# Patient Record
Sex: Male | Born: 1955 | Marital: Single | Smoking: Current every day smoker
Health system: Southern US, Community
[De-identification: ages and names within clinical notes are randomized; demographics above are authoritative.]

## PROBLEM LIST (undated history)

## (undated) DIAGNOSIS — F99 Mental disorder, not otherwise specified: Secondary | ICD-10-CM

## (undated) DIAGNOSIS — I2510 Atherosclerotic heart disease of native coronary artery without angina pectoris: Secondary | ICD-10-CM

## (undated) DIAGNOSIS — I10 Essential (primary) hypertension: Secondary | ICD-10-CM

## (undated) DIAGNOSIS — I639 Cerebral infarction, unspecified: Secondary | ICD-10-CM

## (undated) DIAGNOSIS — J45909 Unspecified asthma, uncomplicated: Secondary | ICD-10-CM

---

## 2006-01-04 ENCOUNTER — Emergency Department: Payer: Self-pay | Admitting: Emergency Medicine

## 2007-02-04 ENCOUNTER — Inpatient Hospital Stay: Payer: Self-pay | Admitting: Unknown Physician Specialty

## 2008-04-28 ENCOUNTER — Emergency Department: Payer: Self-pay | Admitting: Emergency Medicine

## 2009-04-29 ENCOUNTER — Emergency Department: Payer: Self-pay | Admitting: Emergency Medicine

## 2010-02-28 ENCOUNTER — Inpatient Hospital Stay: Payer: Self-pay | Admitting: Internal Medicine

## 2011-02-04 ENCOUNTER — Inpatient Hospital Stay: Payer: Self-pay | Admitting: Internal Medicine

## 2011-05-11 ENCOUNTER — Inpatient Hospital Stay: Payer: Self-pay | Admitting: Internal Medicine

## 2012-04-02 LAB — COMPREHENSIVE METABOLIC PANEL
Albumin: 3.8 g/dL (ref 3.4–5.0)
Alkaline Phosphatase: 105 U/L (ref 50–136)
Anion Gap: 10 (ref 7–16)
BUN: 13 mg/dL (ref 7–18)
Bilirubin,Total: 0.5 mg/dL (ref 0.2–1.0)
Calcium, Total: 9.1 mg/dL (ref 8.5–10.1)
Chloride: 106 mmol/L (ref 98–107)
Co2: 25 mmol/L (ref 21–32)
EGFR (African American): >60
EGFR (Non-African Amer.): >60
Glucose: 83 mg/dL (ref 65–99)
Potassium: 3.3 mmol/L — ABNORMAL LOW (ref 3.5–5.1)
SGOT(AST): 39 U/L — ABNORMAL HIGH (ref 15–37)
Total Protein: 8.1 g/dL (ref 6.4–8.2)

## 2012-04-02 LAB — URINALYSIS, COMPLETE
Bilirubin,UR: NEGATIVE
Blood: NEGATIVE
Glucose,UR: NEGATIVE mg/dL (ref 0–75)
Leukocyte Esterase: NEGATIVE
Ph: 5 (ref 4.5–8.0)
Protein: 30
Specific Gravity: 1.03 (ref 1.003–1.030)
Squamous Epithelial: <1

## 2012-04-02 LAB — TROPONIN I: Troponin-I: <0.02 ng/mL

## 2012-04-02 LAB — CK TOTAL AND CKMB (NOT AT ARMC)
CK, Total: 103 U/L (ref 35–232)
CK-MB: 0.8 ng/mL (ref 0.5–3.6)

## 2012-04-02 LAB — CBC
HCT: 52 % (ref 40.0–52.0)
MCH: 33.3 pg (ref 26.0–34.0)
MCV: 97 fL (ref 80–100)
Platelet: 348 10*3/uL (ref 150–440)
RBC: 5.37 10*6/uL (ref 4.40–5.90)
RDW: 13.7 % (ref 11.5–14.5)
WBC: 6.8 10*3/uL (ref 3.8–10.6)

## 2012-04-03 ENCOUNTER — Inpatient Hospital Stay: Payer: Self-pay | Admitting: Internal Medicine

## 2012-04-03 DIAGNOSIS — I359 Nonrheumatic aortic valve disorder, unspecified: Secondary | ICD-10-CM

## 2012-04-03 LAB — CBC WITH DIFFERENTIAL/PLATELET
Basophil %: 0.6 %
HCT: 47.8 % (ref 40.0–52.0)
HGB: 16.4 g/dL (ref 13.0–18.0)
Lymphocyte %: 56.7 %
MCHC: 34.3 g/dL (ref 32.0–36.0)
MCV: 97 fL (ref 80–100)
Monocyte %: 11.9 %
Neutrophil #: 1.6 10*3/uL (ref 1.4–6.5)
Platelet: 319 10*3/uL (ref 150–440)
WBC: 5.4 10*3/uL (ref 3.8–10.6)

## 2012-04-03 LAB — LIPID PANEL
Cholesterol: 153 mg/dL (ref 0–200)
HDL Cholesterol: 27 mg/dL — ABNORMAL LOW (ref 40–60)
Triglycerides: 115 mg/dL (ref 0–200)
VLDL Cholesterol, Calc: 23 mg/dL (ref 5–40)

## 2012-04-03 LAB — HEMOGLOBIN A1C: Hemoglobin A1C: 5.5 % (ref 4.2–6.3)

## 2012-04-04 LAB — MAGNESIUM: Magnesium: 1.9 mg/dL

## 2012-04-04 LAB — POTASSIUM: Potassium: 3.6 mmol/L (ref 3.5–5.1)

## 2012-04-23 ENCOUNTER — Other Ambulatory Visit: Payer: Self-pay | Admitting: Neurology

## 2012-04-23 LAB — URINALYSIS, COMPLETE
Bacteria: NONE SEEN
Bilirubin,UR: NEGATIVE
Blood: NEGATIVE
Glucose,UR: NEGATIVE mg/dL (ref 0–75)
Leukocyte Esterase: NEGATIVE
Nitrite: NEGATIVE
Protein: NEGATIVE
RBC,UR: <1 /HPF (ref 0–5)
WBC UR: NONE SEEN /HPF (ref 0–5)

## 2012-04-24 LAB — URINE CULTURE

## 2012-09-28 ENCOUNTER — Encounter (HOSPITAL_COMMUNITY): Payer: Self-pay | Admitting: *Deleted

## 2012-09-28 ENCOUNTER — Emergency Department: Payer: Self-pay | Admitting: Emergency Medicine

## 2012-09-28 ENCOUNTER — Inpatient Hospital Stay (HOSPITAL_COMMUNITY)
Admission: EM | Admit: 2012-09-28 | Discharge: 2012-10-05 | DRG: 897 | Disposition: A | Discharge status: Home / Self Care | Payer: Medicare Other | Source: Intra-hospital | Attending: Psychiatry | Admitting: Psychiatry

## 2012-09-28 DIAGNOSIS — I2510 Atherosclerotic heart disease of native coronary artery without angina pectoris: Secondary | ICD-10-CM | POA: Diagnosis present

## 2012-09-28 DIAGNOSIS — Z79899 Other long term (current) drug therapy: Secondary | ICD-10-CM

## 2012-09-28 DIAGNOSIS — J45909 Unspecified asthma, uncomplicated: Secondary | ICD-10-CM | POA: Diagnosis present

## 2012-09-28 DIAGNOSIS — F10129 Alcohol abuse with intoxication, unspecified: Secondary | ICD-10-CM | POA: Diagnosis present

## 2012-09-28 DIAGNOSIS — F1020 Alcohol dependence, uncomplicated: Principal | ICD-10-CM | POA: Diagnosis present

## 2012-09-28 DIAGNOSIS — F1410 Cocaine abuse, uncomplicated: Secondary | ICD-10-CM | POA: Diagnosis present

## 2012-09-28 DIAGNOSIS — I10 Essential (primary) hypertension: Secondary | ICD-10-CM | POA: Diagnosis present

## 2012-09-28 HISTORY — DX: Cerebral infarction, unspecified: I63.9

## 2012-09-28 HISTORY — DX: Mental disorder, not otherwise specified: F99

## 2012-09-28 HISTORY — DX: Atherosclerotic heart disease of native coronary artery without angina pectoris: I25.10

## 2012-09-28 HISTORY — DX: Essential (primary) hypertension: I10

## 2012-09-28 HISTORY — DX: Unspecified asthma, uncomplicated: J45.909

## 2012-09-28 LAB — URINALYSIS, COMPLETE
Bacteria: NONE SEEN
Blood: NEGATIVE
Glucose,UR: NEGATIVE mg/dL (ref 0–75)
Ketone: NEGATIVE
Leukocyte Esterase: NEGATIVE
Ph: 5 (ref 4.5–8.0)
Protein: NEGATIVE
RBC,UR: NONE SEEN /HPF (ref 0–5)
Squamous Epithelial: NONE SEEN
WBC UR: 1 /HPF (ref 0–5)

## 2012-09-28 LAB — COMPREHENSIVE METABOLIC PANEL
Albumin: 4.1 g/dL (ref 3.4–5.0)
Alkaline Phosphatase: 106 U/L (ref 50–136)
BUN: 13 mg/dL (ref 7–18)
Bilirubin,Total: 0.6 mg/dL (ref 0.2–1.0)
Calcium, Total: 9.6 mg/dL (ref 8.5–10.1)
Chloride: 102 mmol/L (ref 98–107)
Co2: 30 mmol/L (ref 21–32)
Creatinine: 0.89 mg/dL (ref 0.60–1.30)
Glucose: 92 mg/dL (ref 65–99)
Sodium: 136 mmol/L (ref 136–145)

## 2012-09-28 LAB — DRUG SCREEN, URINE
Amphetamines, Ur Screen: NEGATIVE (ref ?–1000)
Benzodiazepine, Ur Scrn: NEGATIVE (ref ?–200)
MDMA (Ecstasy)Ur Screen: NEGATIVE (ref ?–500)
Methadone, Ur Screen: NEGATIVE (ref ?–300)
Phencyclidine (PCP) Ur S: NEGATIVE (ref ?–25)
Tricyclic, Ur Screen: NEGATIVE (ref ?–1000)

## 2012-09-28 LAB — TSH: Thyroid Stimulating Horm: 1.16 u[IU]/mL

## 2012-09-28 LAB — CBC
MCH: 29.5 pg (ref 26.0–34.0)
RDW: 13.3 % (ref 11.5–14.5)

## 2012-09-28 LAB — ETHANOL: Ethanol %: <0.003 % (ref 0.000–0.080)

## 2012-09-28 MED ORDER — ADULT MULTIVITAMIN W/MINERALS CH
1.0000 | ORAL_TABLET | Freq: Every day | ORAL | Status: DC
  Administered 2012-09-28 – 2012-10-05 (×8): 1 via ORAL
  Filled 2012-09-28 (×11): qty 1

## 2012-09-28 MED ORDER — CHLORDIAZEPOXIDE HCL 25 MG PO CAPS
25.0000 mg | ORAL_CAPSULE | Freq: Three times a day (TID) | ORAL | Status: AC
  Administered 2012-09-30 – 2012-10-01 (×3): 25 mg via ORAL
  Filled 2012-09-28 (×3): qty 1

## 2012-09-28 MED ORDER — LOPERAMIDE HCL 2 MG PO CAPS: 2.0000 mg | ORAL_CAPSULE | ORAL | Status: AC | PRN

## 2012-09-28 MED ORDER — CHLORDIAZEPOXIDE HCL 25 MG PO CAPS
25.0000 mg | ORAL_CAPSULE | Freq: Once | ORAL | Status: AC
  Administered 2012-09-28: 25 mg via ORAL
  Filled 2012-09-28: qty 1

## 2012-09-28 MED ORDER — CHLORDIAZEPOXIDE HCL 25 MG PO CAPS
25.0000 mg | ORAL_CAPSULE | Freq: Four times a day (QID) | ORAL | Status: AC | PRN
  Administered 2012-09-30: 25 mg via ORAL
  Filled 2012-09-28: qty 1

## 2012-09-28 MED ORDER — VITAMIN B-1 100 MG PO TABS
100.0000 mg | ORAL_TABLET | Freq: Every day | ORAL | Status: DC
  Administered 2012-09-29 – 2012-10-05 (×7): 100 mg via ORAL
  Filled 2012-09-28 (×9): qty 1

## 2012-09-28 MED ORDER — CHLORDIAZEPOXIDE HCL 25 MG PO CAPS
25.0000 mg | ORAL_CAPSULE | Freq: Four times a day (QID) | ORAL | Status: AC
  Administered 2012-09-28 – 2012-09-30 (×6): 25 mg via ORAL
  Filled 2012-09-28 (×6): qty 1

## 2012-09-28 MED ORDER — MAGNESIUM HYDROXIDE 400 MG/5ML PO SUSP
30.0000 mL | Freq: Every day | ORAL | Status: DC | PRN
  Administered 2012-09-29: 30 mL via ORAL

## 2012-09-28 MED ORDER — NICOTINE 21 MG/24HR TD PT24
21.0000 mg | MEDICATED_PATCH | Freq: Every day | TRANSDERMAL | Status: DC
  Administered 2012-09-29 – 2012-10-05 (×7): 21 mg via TRANSDERMAL
  Filled 2012-09-28 (×9): qty 1

## 2012-09-28 MED ORDER — CHLORDIAZEPOXIDE HCL 25 MG PO CAPS
25.0000 mg | ORAL_CAPSULE | ORAL | Status: AC
  Administered 2012-10-01 – 2012-10-02 (×2): 25 mg via ORAL
  Filled 2012-09-28 (×2): qty 1

## 2012-09-28 MED ORDER — CHLORDIAZEPOXIDE HCL 25 MG PO CAPS
25.0000 mg | ORAL_CAPSULE | Freq: Every day | ORAL | Status: AC
  Administered 2012-10-03: 25 mg via ORAL
  Filled 2012-09-28: qty 1

## 2012-09-28 MED ORDER — ONDANSETRON 4 MG PO TBDP: 4.0000 mg | ORAL_TABLET | Freq: Four times a day (QID) | ORAL | Status: AC | PRN

## 2012-09-28 MED ORDER — ALUM & MAG HYDROXIDE-SIMETH 200-200-20 MG/5ML PO SUSP: 30.0000 mL | ORAL | Status: DC | PRN

## 2012-09-28 MED ORDER — THIAMINE HCL 100 MG/ML IJ SOLN: 100.0000 mg | Freq: Once | INTRAMUSCULAR | Status: DC

## 2012-09-28 MED ORDER — HYDROXYZINE HCL 25 MG PO TABS: 25.0000 mg | ORAL_TABLET | Freq: Four times a day (QID) | ORAL | Status: AC | PRN

## 2012-09-28 MED ORDER — ACETAMINOPHEN 325 MG PO TABS: 650.0000 mg | ORAL_TABLET | Freq: Four times a day (QID) | ORAL | Status: DC | PRN

## 2012-09-28 NOTE — Progress Notes (Signed)
Patient ID: Mitchell White, male   DOB: 20-Mar-1956, 57 y.o.   MRN: [id number] Admission note: D:Patient is a  Voluntary admission in no acute distress for ETOH dependence. Pt requesting ETOH detox. Pt stated drinking 1 or more pint of white liquor daily. Pt stated he has been drinking since the age of 40. Pt stated last drink was 09/27/2012. 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 Pt does not recall when last stroke occurred. Pt has a history of CAD, HTN and asthma. Pt has left sided weakness. Pt has no teeth and does not wear dentures. Pt wears glasses. Pt has a DUI court date on November 11, 2013. Pt has so signs of withdrawals. Pt denies SI/HI/AVH and pain  A: Pt admitted to unit per protocol, skin assessment and search done.  Pt educated on therapeutic milieu rules. Pt was introduced to milieu by nursing staff. Fall risk safety plan explained to the patient.   R: Pt was receptive to education. Pt contracted for safety, 15 min safety checks started. Clinical research associate offered support

## 2012-09-28 NOTE — BH Assessment (Signed)
Assessment Note   Mitchell White is an 57 y.o. male. Pt presented at [facility] ED requesting alcohol detox. Drinking 1 pint to 1 quart of white liquor daily. Drinking since 57 years old. No SI, HI or psychosis. Last drink 09/27/2012, currently in withdrawal, nausea, headache, anxiety, shakes, and sweats. Denies history of seizures.  Reports previous detox at [facility], and [facility] in 2008. Has had 6 DUIs, most recent has a court date 10/11/2012. Accepted for inpatient alcohol detox by Armandina Stammer NP.  Axis I: Alcohol Dependance and Cocaine Abuse Axis II: Deferred Axis III:  Past Medical History  Diagnosis Date  . Mental disorder   . Coronary artery disease   . Asthma   . Stroke   . Hypertension    Axis IV: economic problems, other psychosocial or environmental problems and problems related to social environment Axis V: 31-40 impairment in reality testing  Past Medical History:  Past Medical History  Diagnosis Date  . Mental disorder   . Coronary artery disease   . Asthma   . Stroke   . Hypertension     No past surgical history on file.  Family History: No family history on file.  Social History:  reports that he has been smoking Cigarettes.  He has been smoking about 1.00 pack per day. He does not have any smokeless tobacco history on file. He reports that he drinks about 42.0 ounces of alcohol per week. He reports that he uses illicit drugs (Cocaine).  Additional Social History:  Alcohol / Drug Use Pain Medications: not abusing Prescriptions: not abuse  Over the Counter: not abusing History of alcohol / drug use?: Yes Substance #1 Name of Substance 1: alcohol 1 - Age of First Use: 12 1 - Amount (size/oz): Quart white liquor 1 - Frequency: daily 1 - Duration: years 1 - Last Use / Amount: 09/27/2012 Substance #2 Name of Substance 2: cocaine 2 - Age of First Use: nos 2 - Amount (size/oz): nos 2 - Frequency: nos 2 - Duration: nos 2 - Last Use / Amount: nos  CIWA:   COWS:     Allergies: Allergies no known allergies  Home Medications:  (Not in a hospital admission)  OB/GYN Status:  No LMP for male patient.  General Assessment Data Location of Assessment: [facility] Assessment Services Living Arrangements: Spouse/significant other (gf) Can pt return to current living arrangement?: Yes Admission Status: Voluntary Is patient capable of signing voluntary admission?: Yes Transfer from: Acute Hospital Referral Source: MD  Education Status Is patient currently in school?: No  Risk to self Suicidal Ideation: No Suicidal Intent: No Is patient at risk for suicide?: No Suicidal Plan?: No Access to Means: No Previous Attempts/Gestures: No Other Self Harm Risks: intoxicated, poor impulse control Intentional Self Injurious Behavior: None Family Suicide History: Unknown Recent stressful life event(s): Financial Problems;Other (Comment) (reduced sex drive) Persecutory voices/beliefs?: No Depression: No Substance abuse history and/or treatment for substance abuse?: Yes Suicide prevention information given to non-admitted patients: Not applicable  Risk to Others Homicidal Ideation: No Thoughts of Harm to Others: No Current Homicidal Intent: No Current Homicidal Plan: No Access to Homicidal Means: No History of harm to others?: No Assessment of Violence: None Noted Does patient have access to weapons?: No Criminal Charges Pending?: Yes Describe Pending Criminal Charges: DUI Does patient have a court date: Yes Court Date: 10/11/12  Psychosis Hallucinations: None noted Delusions: None noted  Mental Status Report Appear/Hygiene: Disheveled Eye Contact: Fair Motor Activity: Restlessness Speech: Logical/coherent Level of  Consciousness: Alert Mood: Ashamed/humiliated Affect: Anxious Anxiety Level: Moderate Thought Processes: Relevant Judgement: Impaired Orientation: Person;Place;Time;Situation Obsessive Compulsive Thoughts/Behaviors: None  Cognitive  Functioning Concentration: Decreased Memory: Recent Intact;Remote Intact IQ: Average Insight: Fair Impulse Control: Fair Appetite: Fair Weight Loss: 0 Weight Gain: 0 Sleep: Decreased Total Hours of Sleep:  (not restful) Vegetative Symptoms: None  ADLScreening [facility] Assessment Services) Patient's cognitive ability adequate to safely complete daily activities?: Yes Patient able to express need for assistance with ADLs?: Yes Independently performs ADLs?: Yes (appropriate for developmental age)  Abuse/Neglect [facility]) Physical Abuse: Denies Verbal Abuse: Denies Sexual Abuse: Denies  Prior Inpatient Therapy Prior Inpatient Therapy: Yes Prior Therapy Dates: 2008 Prior Therapy Facilty/Provider(s): [place] Reason for Treatment: Alcohol Dep  Prior Outpatient Therapy Prior Outpatient Therapy: Yes Prior Therapy Dates: nos Prior Therapy Facilty/Provider(s): nos Reason for Treatment: addiction  ADL Screening (condition at time of admission) Patient's cognitive ability adequate to safely complete daily activities?: Yes Patient able to express need for assistance with ADLs?: Yes Independently performs ADLs?: Yes (appropriate for developmental age) Weakness of Legs: None Weakness of Arms/Hands: None  Home Assistive Devices/Equipment Home Assistive Devices/Equipment: None    Abuse/Neglect Assessment (Assessment to be complete while patient is alone) Physical Abuse: Denies Verbal Abuse: Denies Sexual Abuse: Denies       Nutrition Screen- [facility] Adult/[facility]/AP Patient's home diet: Regular Have you recently lost weight without trying?: No Have you been eating poorly because of a decreased appetite?: No Malnutrition Screening Tool Score: 0  Additional Information 1:1 In Past 12 Months?: No CIRT Risk: No Elopement Risk: No Does patient have medical clearance?: Yes     Disposition:  Disposition Initial Assessment Completed: Yes Disposition of Patient: Inpatient treatment  program Type of inpatient treatment program: Adult  On Site Evaluation by:   Reviewed with Physician:     Conan Bowens 09/28/2012 6:00 PM

## 2012-09-28 NOTE — Tx Team (Signed)
Initial Interdisciplinary Treatment Plan  PATIENT STRENGTHS: (choose at least two) Ability for insight Average or above average intelligence Supportive family/friends  PATIENT STRESSORS: Health problems Legal issue Substance abuse   PROBLEM LIST: Problem List/Patient Goals Date to be addressed Date deferred Reason deferred Estimated date of resolution  ETOH 09/28/2012                                                      DISCHARGE CRITERIA:  Ability to meet basic life and health needs Improved stabilization in mood, thinking, and/or behavior Motivation to continue treatment in a less acute level of care Verbal commitment to aftercare and medication compliance Withdrawal symptoms are absent or subacute and managed without 24-hour nursing intervention  PRELIMINARY DISCHARGE PLAN: Attend PHP/IOP Attend 12-step recovery group Outpatient therapy Return to previous living arrangement  PATIENT/FAMIILY INVOLVEMENT: This treatment plan has been presented to and reviewed with the patient, Mitchell White, and/or family member,   The patient and family have been given the opportunity to ask questions and make suggestions.  JEHU-APPIAH, LINDA K 09/28/2012, 8:29 PM

## 2012-09-29 ENCOUNTER — Encounter (HOSPITAL_COMMUNITY): Payer: Self-pay | Admitting: Psychiatry

## 2012-09-29 MED ORDER — ASPIRIN 81 MG PO TABS: 81.0000 mg | ORAL_TABLET | Freq: Every day | ORAL | Status: DC

## 2012-09-29 MED ORDER — LISINOPRIL 20 MG PO TABS
20.0000 mg | ORAL_TABLET | Freq: Every day | ORAL | Status: DC
  Administered 2012-09-29 – 2012-10-02 (×4): 20 mg via ORAL
  Filled 2012-09-29 (×6): qty 1

## 2012-09-29 MED ORDER — HYDROCHLOROTHIAZIDE 25 MG PO TABS
25.0000 mg | ORAL_TABLET | Freq: Every day | ORAL | Status: DC
  Administered 2012-09-29 – 2012-10-05 (×7): 25 mg via ORAL
  Filled 2012-09-29 (×10): qty 1

## 2012-09-29 MED ORDER — METOPROLOL SUCCINATE ER 100 MG PO TB24
100.0000 mg | ORAL_TABLET | Freq: Every day | ORAL | Status: DC
Start: 2012-09-29 — End: 2012-10-05
  Administered 2012-09-29 – 2012-10-05 (×7): 100 mg via ORAL
  Filled 2012-09-29 (×9): qty 1

## 2012-09-29 MED ORDER — ASPIRIN EC 81 MG PO TBEC
81.0000 mg | DELAYED_RELEASE_TABLET | Freq: Every day | ORAL | Status: DC
  Administered 2012-09-29 – 2012-10-05 (×7): 81 mg via ORAL
  Filled 2012-09-29 (×10): qty 1

## 2012-09-29 MED ORDER — CLOPIDOGREL BISULFATE 75 MG PO TABS
75.0000 mg | ORAL_TABLET | Freq: Every day | ORAL | Status: DC
  Administered 2012-09-29 – 2012-10-05 (×7): 75 mg via ORAL
  Filled 2012-09-29 (×11): qty 1

## 2012-09-29 MED ORDER — SIMVASTATIN 20 MG PO TABS
20.0000 mg | ORAL_TABLET | Freq: Every day | ORAL | Status: DC
  Administered 2012-09-29 – 2012-10-04 (×6): 20 mg via ORAL
  Filled 2012-09-29 (×8): qty 1

## 2012-09-29 MED ORDER — ONE-DAILY MULTI VITAMINS PO TABS: 1.0000 | ORAL_TABLET | Freq: Every day | ORAL | Status: DC

## 2012-09-29 MED ORDER — ALBUTEROL SULFATE HFA 108 (90 BASE) MCG/ACT IN AERS: 2.0000 | INHALATION_SPRAY | RESPIRATORY_TRACT | Status: DC | PRN

## 2012-09-29 NOTE — Progress Notes (Signed)
.  [facility] Group Notes:  (Nursing/MHT/Case Management/Adjunct)  Date:  09/29/2012  Time:  2100  Type of Therapy:  wrap up group  Participation Level:  Minimal  Participation Quality:  Appropriate, Attentive and limited sharing  Affect:  Appropriate and Flat  Cognitive:  Appropriate  Insight:  Limited  Engagement in Group:  Limited  Modes of Intervention:  Clarification, Education and Support  Summary of Progress/Problems: Pt was attentive and supportive but answered very short simple answers to this writers questions. Pt reported positives of the day were a phone call with his mother and nephew and watching a movie.  When prompted to share a positive about his treatment or our program, pt stated "rest of the day was good".  Pt stated that his goal for himself was to not drink.   Mitchell White 09/29/2012, 11:56 PM

## 2012-09-29 NOTE — Progress Notes (Signed)
Adult Psychoeducational Group Note  Date:  09/29/2012 Time:  6:55 PM  Group Topic/Focus:  Overcoming Stress:   The focus of this group is to define stress and help patients assess their triggers.  Participation Level:  Minimal  Participation Quality:  Appropriate  Affect:  Appropriate  Cognitive:  Appropriate  Insight: Good  Engagement in Group:  Engaged  Modes of Intervention:  Discussion and Support  Additional Comments:  Pt participated in group discussion on stress, including triggers for stress, physiological signs that one is stressed, and positive coping skills for dealing with stress.Pt participated mostly when called on by staff. Pt shared coping skills he uses in order to cope with feelings of stress.    Mitchell White K 09/29/2012, 6:55 PM

## 2012-09-29 NOTE — Progress Notes (Signed)
D   Pt is depressed and sad   He denies withdrawal symptoms   His interactions with others are minimal but appropriate   He  Attends and participates in groups    A   Verbal support given  Medications administered and effectiveness monitored  Q 15 min checks R   Pt safe at present

## 2012-09-29 NOTE — [facility] Suicide Risk Assessment (Signed)
Suicide Risk Assessment  Admission Assessment     Nursing information obtained from:  Patient Demographic factors:  Male;Unemployed Current Mental Status:  NA Loss Factors:  Decline in physical health;Legal issues Historical Factors:  NA Risk Reduction Factors:  Positive therapeutic relationship  CLINICAL FACTORS:   Alcohol/Substance Abuse/Dependencies  COGNITIVE FEATURES THAT CONTRIBUTE TO RISK:  Closed-mindedness Thought constriction (tunnel vision)    SUICIDE RISK:   Moderate:  Frequent suicidal ideation with limited intensity, and duration, some specificity in terms of plans, no associated intent, good self-control, limited dysphoria/symptomatology, some risk factors present, and identifiable protective factors, including available and accessible social support.  PLAN OF CARE: [facility] detox protocol                              Supportive approach/coping skills/relapse prevention                              Reassess co morbidities  I certify that inpatient services furnished can reasonably be expected to improve the patient's condition.  Kyndall Amero A 09/29/2012, 3:12 PM

## 2012-09-29 NOTE — Progress Notes (Signed)
[facility] LCSW Aftercare Discharge Planning Group Note  09/29/2012 11:42 AM  Participation Quality:  Appropriate and Attentive  Affect:  Blunted, Depressed and Flat  Cognitive:  Alert and Appropriate  Insight:  Limited  Engagement in Group:  Engaged  Modes of Intervention:  Education, Exploration, Problem-solving, Rapport Building and Support  Summary of Progress/Problems: Pt reports that he is "better" today and that he was admitted to the hospital because he "was ready to stop drinking." Pt affect is flat and depressed.  He is unable to rate his depressive symptoms but states that he "isn't great".  Pt denies SI/HI.   Bournes, Roshelle L 09/29/2012, 11:42 AM

## 2012-09-29 NOTE — Progress Notes (Signed)
D:  Patient's self inventory sheet, patient has fair sleep, poor appetite, low energy level, poor attention span.  Rated depression and hopelessness #9, anxiety #5.  Denied withdrawals, but feels weak.  Denied SI.  Denied pain.  Plans to go home after discharge.  No problems with meds, buys his own meds. A:  Medications administered per MD order.  Support and encouragement given throughout day. R:  Following treatment plan.   Denied SI and HI.   Denied A/V hallucinations.  Contracts for safety.   Patient ambulates with walker.  Has been cooperative and pleasant.

## 2012-09-29 NOTE — Progress Notes (Signed)
[facility] LCSW Aftercare Discharge Planning Group Note  09/29/2012 2:35 PM  Participation Quality:  Appropriate and Attentive  Affect:  Blunted and Flat  Cognitive:  Alert and Appropriate  Insight:  Limited  Engagement in Group:  Limited  Modes of Intervention:  Discussion, Education, Problem-solving, Rapport Building and Support  Summary of Progress/Problems: Pt attended psychoeducation group.  Today's theme was "Living a Balanced Life."  Pts processed what balance means to them, what ways help up find balance, and what things make [place] lose balance.  Pts also took turns discussing things that they would like to let go of and things they would like to hold on to in order to maintain balance in their lives.  Pt was resistant to share during group and lacked insight.  He shared when probed further by speaker, but was only able to provide superficial answers.  Pt shared that he would like to let go of "no good friends".  He disclosed that every time he gets around them that he gets in trouble.  Pt states that he has tried to remove himself from these people in the past.  He was unable to process ways to make this time different.  Pt shares that attending church more regularly will help him maintain balance.  SW encouraged pt to seek supportive positive relationships within his church and AA groups. Bournes, Roshelle L 09/29/2012, 2:35 PM

## 2012-09-29 NOTE — Progress Notes (Addendum)
Recreation Therapy Notes  Date: 03.13.2014  Time: 3:00pm  Location: [address] Day Room   Group Topic/Focus: Leisure Education   Participation Level:  Active   Participation Quality:  Appropriate   Affect:  Euthymic   Cognitive:  Appropriate   Additional Comments: Patient viewed informational video on Qi[employer]ong. Patient chose to participate in instructional portion of video shown. Patient participated from a seated position. Patient did not participate in discussion about using recreation and leisure as a coping mechanism. Patient stated a leisure and recreation activity he can use as a coping mechanism: "fishing." Patient stated he goes to a pond near his house, so he does not catch big fish. LRT encouraged patient to use fishing as a coping mechanism post D/C.  Marykay Lex Blanchfield, LRT/CTRS  Blanchfield, Denise L 09/29/2012 4:21 PM

## 2012-09-29 NOTE — H&P (Signed)
Psychiatric Admission Assessment Adult  Patient Identification:  Mitchell White  Date of Evaluation:  09/29/2012  Chief Complaint:  Alcohol Dependance  History of Present Illness: This is an admission assessment for this 57 year old African-American male. Admitted to [facility] from [facility] with complaints of excessive alcohol drinking. Patient reports, "I went to the [facility] ED yesterday. My old lady drove me to the hospital. I had initially gone to the [facility] to get some help with my drinking, I was sent to the hospital instead. I have been drinking too much alcohol since I was 16. It got worse 3-4 years ago. I got a lot of shit going on in my mind. Drinking alcohol makes them go away for a while. Alcoholism has caused me both personal and legal problems.  I have been charged with DUI and sent to jail x 4 times. I have a pending court charges/date in April of this year for drunk driving. I drink a pint of white liquor daily, and I have been doing this for a long time. I am not depressed. I ain't crazy, or thinking about hurting me or no body else. I just need help with my drinking".  Elements:  Location:  [facility] adult unit. Quality:  "I have drinking a pint of white liquor daily x 3-4 years". Severity:  "I'm getting in trouble with the law, DUI and jail times". Timing:  "My excessive drinking has been worse for the past 3-4 years". Duration:  "I have been drinking since I was 16". Context:  DUI, Court dates, jail times, financial problems".  Associated Signs/Synptoms:  Depression Symptoms:  feelings of worthlessness/guilt,  (Hypo) Manic Symptoms:  Irritable Mood,  Anxiety Symptoms:  Excessive Worry,  Psychotic Symptoms:  Hallucinations: Denies  PTSD Symptoms: Had a traumatic exposure:  None reported  Psychiatric Specialty Exam: Physical Exam  Constitutional: He is oriented to person, place, and time. He appears well-developed.  HENT:  Head: Normocephalic.  Eyes: Pupils are equal, round, and reactive  to light.  Neck: Normal range of motion.  Respiratory: Effort normal.  GI: Soft.  Musculoskeletal:  Limited ROM to both upper and lower extremities.  Neurological: He is alert and oriented to person, place, and time.  Skin: Skin is warm and dry.  Psychiatric: His behavior is normal. Thought content normal. His mood appears anxious. His speech is slurred. Cognition and memory are normal. He expresses inappropriate judgment.    Review of Systems  Constitutional: Negative.   HENT: Negative.   Eyes: Negative.   Respiratory: Negative.   Cardiovascular: Negative.   Gastrointestinal: Negative.   Genitourinary: Negative.   Musculoskeletal: Positive for joint pain. Falls: At risks for falls due to left sides weakness.       At risks for fall.  Skin: Negative.   Psychiatric/Behavioral: Positive for substance abuse (Alcoholism). Negative for hallucinations and memory loss. The patient is nervous/anxious. The patient does not have insomnia.     Blood pressure 123/86, pulse 75, temperature 98 F (36.7 C), temperature source Oral, resp. rate 16, height 5\' 10"  (1.778 m), weight 87.544 kg (193 lb).Body mass index is 27.69 kg/(m^2).  General Appearance: Casual  Eye Contact::  Good  Speech:  Slurred  Volume:  Normal  Mood:  Anxious  Affect:  Flat  Thought Process:  Coherent and Goal Directed  Orientation:  Full (Time, Place, and Person)  Thought Content:  Rumination and Denies hallucinations.  Suicidal Thoughts:  No  Homicidal Thoughts:  No  Memory:  Immediate;  Good Recent;   Good Remote;   Good  Judgement:  Impaired  Insight:  Fair  Psychomotor Activity:  Anxiousness  Concentration:  Fair  Recall:  Good  Akathisia:  No  Handed:  Right  AIMS (if indicated):     Assets:  Desire for Improvement  Sleep:  Number of Hours: 6.5    Past Psychiatric History: Diagnosis: Alcohol dependence, Alcohol abuse  Hospitalizations: [facility]  Outpatient Care: With Dr. Vesta Mixer in [place], [place]   Substance Abuse Care: None reported  Self-Mutilation: Denies  Suicidal Attempts: Denies attempts and or thoughts  Violent Behaviors: None reported   Past Medical History:   Past Medical History  Diagnosis Date  . Mental disorder   . Coronary artery disease   . Asthma   . Stroke   . Hypertension    Cardiac History:  CAD, HTN,  Allergies:  No Known Allergies  PTA Medications: Prescriptions prior to admission  Medication Sig Dispense Refill  . albuterol (PROVENTIL HFA;VENTOLIN HFA) 108 (90 BASE) MCG/ACT inhaler Inhale 2 puffs into the lungs every 4 (four) hours as needed for wheezing or shortness of breath.      Marland Kitchen aspirin 81 MG tablet Take 81 mg by mouth daily.      . clopidogrel (PLAVIX) 75 MG tablet Take 75 mg by mouth daily.      . hydrochlorothiazide (HYDRODIURIL) 25 MG tablet Take 25 mg by mouth daily.      Marland Kitchen lisinopril (PRINIVIL,ZESTRIL) 40 MG tablet Take 40 mg by mouth daily.      . metoprolol succinate (TOPROL-XL) 100 MG 24 hr tablet Take 100 mg by mouth daily. Take with or immediately following a meal.      . Multiple Vitamin (MULTIVITAMIN) tablet Take 1 tablet by mouth daily.      Marland Kitchen NIFEdipine (PROCARDIA-XL/ADALAT CC) 30 MG 24 hr tablet Take 30 mg by mouth daily.      . pravastatin (PRAVACHOL) 40 MG tablet Take 40 mg by mouth at bedtime.        Previous Psychotropic Medications:  Medication/Dose  See medication lists.               Substance Abuse History in the last 12 months:  yes  Consequences of Substance Abuse: Medical Consequences:  Liver damage, Possible death by overdose Legal Consequences:  Arrests, jail time, Loss of driving privilege. Family Consequences:  Family discord, divorce and or separation.  Social History:  reports that he has been smoking Cigarettes.  He has been smoking about 1.00 pack per day. He does not have any smokeless tobacco history on file. He reports that he drinks about 42.0 ounces of alcohol per week. He reports that he  uses illicit drugs (Cocaine). Additional Social History: Pain Medications: not abusing Prescriptions: not abuse  Over the Counter: not abusing History of alcohol / drug use?: Yes Name of Substance 1: alcohol 1 - Age of First Use: 12 1 - Amount (size/oz): Quart white liquor 1 - Frequency: daily 1 - Duration: years 1 - Last Use / Amount: 09/27/2012 Name of Substance 2: cocaine 2 - Age of First Use: nos 2 - Amount (size/oz): nos 2 - Frequency: nos 2 - Duration: nos 2 - Last Use / Amount: nos  Current Place of Residence: [place], [place]    Place of Birth:  [place]  Family Members: "My girl-friend"  Marital Status:  Single  Children: 0  Sons: 0  Daughters: 0  Relationships: Single  Education:  [employer]  Problems/Performance: None reported  Religious Beliefs/Practices: None reported  History of Abuse (Emotional/Phsycial/Sexual): None reported  Occupational Experiences: Disabled  Military History:  None.  Legal History: Pending court date coming 11/10/12 for DUI.  Hobbies/Interests: None reported.  Family History:  History reviewed. No pertinent family history.  No results found for this or any previous visit (from the past 72 hour(s)). Psychological Evaluations:  Assessment:   AXIS I:  Alcohol dependence AXIS II:  Deferred AXIS III:   Past Medical History  Diagnosis Date  . Mental disorder   . Coronary artery disease   . Asthma   . Stroke   . Hypertension    AXIS IV:  Substance abuse issues. AXIS V:  50  Treatment Plan/Recommendations: 1. Admit for crisis management and stabilization, estimated length of stay 3-5 days.  2. Medication management to reduce current symptoms to base line and improve the patient's overall level of functioning  3. Treat health problems as indicated.  4. Develop treatment plan to decrease risk of relapse upon discharge and the need for readmission.  5. Psycho-social education regarding relapse prevention  and self care.  6. Health care follow up as needed for medical problems.  7. Review, reconcile, and reinstate any pertinent home medications for other health issues where appropriate. 8. Call for consults with hospitalist for any additional specialty patient care services as needed. Treatment Plan Summary: Daily contact with patient to assess and evaluate symptoms and progress in treatment Medication management Supportive approach/coping skills/relapse prevention Current Medications:  Current Facility-Administered Medications  Medication Dose Route Frequency Provider Last Rate Last Dose  . acetaminophen (TYLENOL) tablet 650 mg  650 mg Oral Q6H PRN Sanjuana Kava, NP      . alum & mag hydroxide-simeth (MAALOX/MYLANTA) 200-200-20 MG/5ML suspension 30 mL  30 mL Oral Q4H PRN Sanjuana Kava, NP      . chlordiazePOXIDE (LIBRIUM) capsule 25 mg  25 mg Oral Q6H PRN Sanjuana Kava, NP      . chlordiazePOXIDE (LIBRIUM) capsule 25 mg  25 mg Oral QID Sanjuana Kava, NP   25 mg at 09/29/12 0800   Followed by  . [START ON 09/30/2012] chlordiazePOXIDE (LIBRIUM) capsule 25 mg  25 mg Oral TID Sanjuana Kava, NP       Followed by  . [START ON 10/01/2012] chlordiazePOXIDE (LIBRIUM) capsule 25 mg  25 mg Oral BH-qamhs Sanjuana Kava, NP       Followed by  . [START ON 10/03/2012] chlordiazePOXIDE (LIBRIUM) capsule 25 mg  25 mg Oral Daily Sanjuana Kava, NP      . hydrOXYzine (ATARAX/VISTARIL) tablet 25 mg  25 mg Oral Q6H PRN Sanjuana Kava, NP      . loperamide (IMODIUM) capsule 2-4 mg  2-4 mg Oral PRN Sanjuana Kava, NP      . magnesium hydroxide (MILK OF MAGNESIA) suspension 30 mL  30 mL Oral Daily PRN Sanjuana Kava, NP      . multivitamin with minerals tablet 1 tablet  1 tablet Oral Daily Sanjuana Kava, NP   1 tablet at 09/29/12 0800  . nicotine (NICODERM CQ - dosed in mg/24 hours) patch 21 mg  21 mg Transdermal Q0600 Sanjuana Kava, NP   21 mg at 09/29/12 4782  . ondansetron (ZOFRAN-ODT) disintegrating tablet 4 mg  4 mg  Oral Q6H PRN Sanjuana Kava, NP      . thiamine (B-1) injection 100 mg  100 mg Intramuscular Once Nicole Kindred I  Nwoko, NP      . thiamine (VITAMIN B-1) tablet 100 mg  100 mg Oral Daily Sanjuana Kava, NP   100 mg at 09/29/12 0801    Observation Level/Precautions:  15 minute checks  Laboratory:  Reviewed ED lab findings on file.  Psychotherapy: Group sessions, AA/NA meetings.   Medications:  See medication lists  Consultations:  As needed.  Discharge Concerns:  Maintaining sobriety  Estimated LOS: 5-7 days,  Other:     I certify that inpatient services furnished can reasonably be expected to improve the patient's condition.   Armandina Stammer I 3/13/201410:12 AM

## 2012-09-30 DIAGNOSIS — F10229 Alcohol dependence with intoxication, unspecified: Secondary | ICD-10-CM

## 2012-09-30 DIAGNOSIS — F1410 Cocaine abuse, uncomplicated: Secondary | ICD-10-CM

## 2012-09-30 NOTE — Progress Notes (Signed)
D: Patient denies SI/HI and A/V hallucinations; patient reports sleep is well; reports appetite is good ; reports energy level is normal ; reports ability to pay attention is good; rates depression as good; rates hopelessness 10/10;   A: Monitored q 15 minutes; patient encouraged to attend groups; patient educated about medications; patient given medications per physician orders; patient encouraged to express feelings and/or concerns  R: Patient is very sad but cooperative; patient [place] steady on feet with his walker;  patient's interaction with staff and peers is appropriate but minimal;; patient is taking medications as prescribed and tolerating medications; patient is attending all groups

## 2012-09-30 NOTE — Progress Notes (Signed)
[facility] Group Notes:  (Nursing/MHT/Case Management/Adjunct)  Date:  09/30/2012  Time:  11:24 AM  Type of Therapy:  Psychoeducational Skills  Participation Level:  Minimal  Participation Quality:  Appropriate and Resistant  Affect:  Appropriate and Flat  Cognitive:  Alert and Appropriate  Insight:  Appropriate  Engagement in Group:  Limited  Modes of Intervention:  Activity and Socialization  Summary of Progress/Problems:Pt was not engaged in Therapeutic Activity where pts were asked to participate in a game called "Would you rather". Pt appeared flat throughout group discussion.    Dalia Heading 09/30/2012, 11:24 AM

## 2012-09-30 NOTE — Progress Notes (Signed)
[facility] LCSW Aftercare Discharge Planning Group Note  09/30/2012 8:45 AM  Participation Quality:  Appropriate  Affect:  Depressed  Cognitive:  Alert and Oriented  Insight: Engaged  Engagement in Group:  Engaged  Modes of Intervention:  Clarification, Exploration, Rapport Building and Support  Summary of Progress/Problems:  Pt denies both suicidal and homicidal ideation.  On a scale of 1 to 10 with ten being the most ever experienced, the patient rates depression at a 5 and anxiety at a 6.  Mitchell White admits that he needs more help than just detox and would like referral after detox.  Willingly asked CSW after group where he might could go for treatment.  CSW to check with [facility] for [facility].    Clide Dales

## 2012-09-30 NOTE — Progress Notes (Signed)
Pt. Has been in bed most of the evening.  He has been instructed to use his walker to ambulate.  Pt. Did want something for HS to help him to relax and he had the librium PRN dose still on hand.  Pt. Had no complaints of pain or discomfort.  Pt. Denies SI and HI.

## 2012-09-30 NOTE — Tx Team (Signed)
Interdisciplinary Treatment Plan Update (Adult)  Date: 09/30/2012  Time Reviewed: 9:47 AM   Progress in Treatment: Attending groups: Yes Participating in groups: Yes Taking medication as prescribed:  Yes Tolerating medication:  Yes Family/Significant othe contact made: Not as yet Patient understands diagnosis: Yes Discussing patient identified problems/goals with staff: Yes Medical problems stabilized or resolved:  Yes Denies suicidal/homicidal ideation: Yes Patient has not harmed self or Others: Yes  New problem(s) identified: None Identified  Discharge Plan or Barriers:  CSW is assessing for appropriate referrals. Patient wants inpatient; [facility] Resident  Additional comments: N/A  Reason for Continuation of Hospitalization: Anxiety Depression Medical Issues Medication stabilization Withdrawal symptoms   Estimated length of stay: 5-7 days  For review of initial/current patient goals, please see plan of care.  Attendees: Patient:     Family:     Physician:  Geoffery Lyons 09/30/2012 9:47 AM   Nursing:   Burnetta Sabin, RN 09/30/2012 9:47 AM   Clinical Social Worker Ronda Fairly 09/30/2012 9:47 AM   Other:  Luella Cook, JMSW Intern  09/30/2012 9:47 AM   Other:  Concha Se, [employer] PA Student 09/30/2012 9:47 AM   Other:  Vesta Mixer Liaison  09/30/2012 9:47 AM   Other:   09/30/2012 9:47 AM    Scribe for Treatment Team:   Carney Bern, LCSWA  09/30/2012 9:47 AM

## 2012-09-30 NOTE — Plan of Care (Signed)
Problem: Ineffective individual coping Goal: STG: Patient will participate in after care plan Outcome: Not Met (add Reason) Patient is [facility], requesting inpatient services. CSW will explore options available to [facility] residents.  Patient unable to go to [facility] due to mobility issues.   Problem: Alteration in mood & ability to function due to Goal: LTG-Patient demonstrates decreased signs of withdrawal (Patient demonstrates decreased signs of withdrawal to the point the patient is safe to return home and continue treatment in an outpatient setting)  Outcome: Not Met (add Reason) Patient reports ongoing physical withdrawal symptoms.

## 2012-09-30 NOTE — Progress Notes (Addendum)
[facility] MD Progress Note  09/30/2012 2:34 PM Mitchell White  MRN:  [id number]  Subjective:  Mitchell White continue to present anxious/worried facial expression about his drinking problems. He endorses that his main goal from here is to get to a long term rehab/treatment facility. [place]. He expresses that he [place] about [place]. He states that he would not want to go there. He expresses how worried he is to go back to the same neighborhood he lives in because they make the home-made wine with corn, potatoes and tomatoes. He endorses that it is too hard to resist drinking the wine when it is right there and easily accessible at any time".   Diagnosis:   Axis I: Alcohol abuse with intoxication, alcohol dependence, Cocaine abuse Axis II: Deferred Axis III:  Past Medical History  Diagnosis Date  . Mental disorder   . Coronary artery disease   . Asthma   . Stroke   . Hypertension    Axis IV: other psychosocial or environmental problems and Substance abuse issues Axis V: 41-50 serious symptoms  ADL's:  Intact  Sleep: Fair  Appetite:  Fair  Suicidal Ideation:  Plan:  No Intent:  No Means:  No Homicidal Ideation:  Plan:  No Intent:  No Means:  No  AEB (as evidenced by): Per patient's reports  Psychiatric Specialty Exam: Review of Systems  Constitutional: Negative.   HENT: Negative.   Eyes: Negative.   Respiratory: Negative.   Cardiovascular: Negative.   Gastrointestinal: Negative.   Genitourinary: Negative.   Musculoskeletal:       Left sides weakness. Ambulates with walker prn.     Skin: Negative.   Neurological: Negative.   Endo/Heme/Allergies: Negative.   Psychiatric/Behavioral: Positive for substance abuse (Hx alcoholism). Negative for depression, suicidal ideas, hallucinations and memory loss. The patient is nervous/anxious (Currently being stabilized with medication.).  The patient does not have insomnia.     Blood pressure 152/107, pulse 65, temperature 98.2 F (36.8 C), temperature source Oral, resp. rate 20, height 5\' 10"  (1.778 m), weight 87.544 kg (193 lb).Body mass index is 27.69 kg/(m^2).  General Appearance: Casual and Fairly Groomed  Eye Contact::  Good  Speech:  Slurred some  Volume:  Normal  Mood:  Anxious and Worried  Affect:  Flat  Thought Process:  Coherent and Goal Directed  Orientation:  Full (Time, Place, and Person)  Thought Content:  Rumination and denies hallucinations.  Suicidal Thoughts:  No  Homicidal Thoughts:  No  Memory:  Immediate;   Good Recent;   Good Remote;   Good  Judgement:  Fair  Insight:  Good  Psychomotor Activity:  Anxious, worried  Concentration:  Fair  Recall:  Good  Akathisia:  No  Handed:  Right  AIMS (if indicated):     Assets:  Desire for Improvement  Sleep:  Number of Hours: 5.25   Current Medications: Current Facility-Administered Medications  Medication Dose Route Frequency Provider Last Rate Last Dose  . acetaminophen (TYLENOL) tablet 650 mg  650 mg Oral Q6H PRN Sanjuana Kava, NP      . albuterol (PROVENTIL HFA;VENTOLIN HFA) 108 (90 BASE) MCG/ACT inhaler 2 puff  2 puff Inhalation Q4H PRN Sanjuana Kava, NP      . alum & mag hydroxide-simeth (MAALOX/MYLANTA) 200-200-20 MG/5ML suspension 30 mL  30 mL Oral Q4H PRN Sanjuana Kava, NP      .  aspirin EC tablet 81 mg  81 mg Oral Daily Rachael Fee, MD   81 mg at 09/30/12 0757  . chlordiazePOXIDE (LIBRIUM) capsule 25 mg  25 mg Oral Q6H PRN Sanjuana Kava, NP      . chlordiazePOXIDE (LIBRIUM) capsule 25 mg  25 mg Oral TID Sanjuana Kava, NP   25 mg at 09/30/12 1213   Followed by  . [START ON 10/01/2012] chlordiazePOXIDE (LIBRIUM) capsule 25 mg  25 mg Oral BH-qamhs Sanjuana Kava, NP       Followed by  . [START ON 10/03/2012] chlordiazePOXIDE (LIBRIUM) capsule 25 mg  25 mg Oral Daily Sanjuana Kava, NP      . clopidogrel (PLAVIX) tablet 75 mg  75 mg Oral Daily  Sanjuana Kava, NP   75 mg at 09/30/12 0757  . hydrochlorothiazide (HYDRODIURIL) tablet 25 mg  25 mg Oral Daily Sanjuana Kava, NP   25 mg at 09/30/12 0757  . hydrOXYzine (ATARAX/VISTARIL) tablet 25 mg  25 mg Oral Q6H PRN Sanjuana Kava, NP      . lisinopril (PRINIVIL,ZESTRIL) tablet 20 mg  20 mg Oral Daily Sanjuana Kava, NP   20 mg at 09/30/12 0757  . loperamide (IMODIUM) capsule 2-4 mg  2-4 mg Oral PRN Sanjuana Kava, NP      . magnesium hydroxide (MILK OF MAGNESIA) suspension 30 mL  30 mL Oral Daily PRN Sanjuana Kava, NP   30 mL at 09/29/12 1315  . metoprolol succinate (TOPROL-XL) 24 hr tablet 100 mg  100 mg Oral Daily Sanjuana Kava, NP   100 mg at 09/30/12 0757  . multivitamin with minerals tablet 1 tablet  1 tablet Oral Daily Sanjuana Kava, NP   1 tablet at 09/30/12 0757  . nicotine (NICODERM CQ - dosed in mg/24 hours) patch 21 mg  21 mg Transdermal Q0600 Sanjuana Kava, NP   21 mg at 09/30/12 0603  . ondansetron (ZOFRAN-ODT) disintegrating tablet 4 mg  4 mg Oral Q6H PRN Sanjuana Kava, NP      . simvastatin (ZOCOR) tablet 20 mg  20 mg Oral q1800 Sanjuana Kava, NP   20 mg at 09/29/12 1810  . thiamine (B-1) injection 100 mg  100 mg Intramuscular Once Sanjuana Kava, NP      . thiamine (VITAMIN B-1) tablet 100 mg  100 mg Oral Daily Sanjuana Kava, NP   100 mg at 09/30/12 0757    Lab Results: No results found for this or any previous visit (from the past 48 hour(s)).  Physical Findings: AIMS: Facial and Oral Movements Muscles of Facial Expression: None, normal Lips and Perioral Area: None, normal Jaw: None, normal Tongue: None, normal,Extremity Movements Upper (arms, wrists, hands, fingers): None, normal Lower (legs, knees, ankles, toes): None, normal, Trunk Movements Neck, shoulders, hips: None, normal, Overall Severity Severity of abnormal movements (highest score from questions above): None, normal Incapacitation due to abnormal movements: None, normal Patient's awareness of abnormal  movements (rate only patient's report): No Awareness, Dental Status Current problems with teeth and/or dentures?: No Does patient usually wear dentures?: No  CIWA:  CIWA-Ar Total: 1 COWS:  COWS Total Score: 1  Treatment Plan Summary: Daily contact with patient to assess and evaluate symptoms and progress in treatment Medication management Work on finding appropriate therapeutic placement Plan: Supportive approach/coping skills/relapse prevention. Encouraged out of room, participation in group sessions and application of coping skills when distressed. Will continue to monitor  response to/adverse effects of medications in use to assure effectiveness. Continue to monitor mood, behavior and interaction with staff and other patients. Continue current plan of care.  Medical Decision Making Problem Points:  Established problem, stable/improving (1), Review of last therapy session (1) and Review of psycho-social stressors (1) Data Points:  Review and summation of old records (2) Review of medication regiment & side effects (2)  I certify that inpatient services furnished can reasonably be expected to improve the patient's condition.   Armandina Stammer I 09/30/2012, 2:34 PM

## 2012-09-30 NOTE — Progress Notes (Signed)
[facility] LCSW Group Therapy  09/30/2012 1:15 PM  Type of Therapy:  Group Therapy 1:15 to 2:30 PM  Participation Level:  Active  Participation Quality:  Appropriate and Attentive  Affect:  Appropriate  Cognitive:  Alert and Oriented  Insight:  Limited,   Engagement in Therapy:  Limited  Modes of Intervention:  Discussion, Education, Rapport Building, Socialization and Support  Summary of Progress/Problems: Group session was focused on feelings about relapse and also  included an educational portion on Post Acute Withdrawal Syndrome (PAWS).  Patients were able to process the information and share feelings related to cycle of addiction.  Mitchell White appeared not to be feeling well and left before group ended although he was attentive while present.     Mitchell White

## 2012-10-01 NOTE — Progress Notes (Signed)
D.  Pt in bed on approach, denies complaints voiced.  Pt went to bed early this shift and is currently resting with eyes closed, respirations even and unlabored.  Did not attend evening group.  Interacting appropriately within milieu.  Denied SI/HI/hallucinations at this time.  A.  Support and encouragement offered  R.  Pt remains safe on unit

## 2012-10-01 NOTE — Progress Notes (Signed)
D:  Per pt self inventory pt reports sleeping well, appetite good, energy level normal, ability to pay attention poor, denies depression and hopelessness at a 6 out of 10, denies SI/HI/AVH, pt ambulates with walker assistance, affect is flat and pt motor activity is slow.  A:  Emotional support provided, Encouraged pt to continue with treatment plan and attend all group activities, q15 min checks maintained for safety.  R:  Pt is receptive, attends groups cooperative and pleasant.

## 2012-10-01 NOTE — Progress Notes (Signed)
The patient attended the A. A. Meeting this evening.  

## 2012-10-01 NOTE — Progress Notes (Signed)
Adult Psychoeducational Group Note  Date:  10/01/2012 Time:  1315  Group Topic/Focus:  Healthy Coping Skills  Participation Level:  Did Not Attend Pt refused to attend group  Anakaren Campion Shari Prows 10/01/2012, 2:06 PM

## 2012-10-01 NOTE — Clinical Social Work Note (Signed)
[facility] Group Notes:  (Clinical Social Work)  10/01/2012     10-11AM  Summary of Progress/Problems:   The main focus of today's process group was for the patient to identify ways in which they have in the past sabotaged their own recovery. Motivational Interviewing was utilized to ask the group members what they get out of their substance use, and what they want to change.  The Stages of Change were explained, and members identified where they currently are with regard to stages of change.  The patient expressed that he drinks alcohol, and that it is always accessible to him because his neighbor keeps it in an unlocked shed that he is welcome to get into.  He does not want to ask his neighbor to limit his access by locking the door, saying "I can't tell him what to do."  He said he needs to move, but he is not going to, and he needs to stop going in the shed, but he won't do that as long as he lives there.  CSW suggested he is in pre-contemplation or possibly early contemplation stage of change.  Type of Therapy:  Group Therapy - Process   Participation Level:  Active  Participation Quality:  Attentive and Sharing  Affect:  Blunted  Cognitive:  Appropriate  Insight:  Engaged  Engagement in Therapy:  Engaged  Modes of Intervention:  Education, Teacher, English as a foreign language, Exploration, Discussion, Motivational Interviewing   Ambrose Mantle, LCSW 10/01/2012, 11:12 AM

## 2012-10-01 NOTE — Progress Notes (Addendum)
Patient ID: Mitchell White, male   DOB: 04/01/1956, 57 y.o.   MRN: [id number] [facility] MD Progress Note  10/01/2012 12:09 PM Jacques Fife  MRN:  [id number]  Subjective:  Labib reports today that he is feeling a little better. Has decided that if he could not get into a rehab treatment place for his alcoholism, he will go home and not hang out with same group of people who uses and supplies his liquor to him. He states that his mood is fair. Endorses some anxiety issues. Continue to denies any suicidal ideations. Endorses some fine tremors. He could to use walker on as needed basis for assistance with mobility.  Diagnosis:   Axis I: Alcohol abuse with intoxication, alcohol dependence, Cocaine abuse Axis II: Deferred Axis III:  Past Medical History  Diagnosis Date  . Mental disorder   . Coronary artery disease   . Asthma   . Stroke   . Hypertension    Axis IV: other psychosocial or environmental problems and Substance abuse issues Axis V: 41-50 serious symptoms  ADL's:  Intact  Sleep: Fair  Appetite:  Fair  Suicidal Ideation:  Plan:  No Intent:  No Means:  No Homicidal Ideation:  Plan:  No Intent:  No Means:  No  AEB (as evidenced by): Per patient's reports  Psychiatric Specialty Exam: Review of Systems  Constitutional: Negative.   HENT: Negative.   Eyes: Negative.   Respiratory: Negative.   Cardiovascular: Negative.   Gastrointestinal: Negative.   Genitourinary: Negative.   Musculoskeletal:       Left sides weakness. Ambulates with walker prn.     Skin: Negative.   Neurological: Negative.   Endo/Heme/Allergies: Negative.   Psychiatric/Behavioral: Positive for substance abuse (Hx alcoholism). Negative for depression, suicidal ideas, hallucinations and memory loss. The patient is nervous/anxious (Currently being stabilized with medication.). The patient does not have insomnia.     Blood pressure 145/98, pulse 61, temperature 96.7 F (35.9 C), temperature source  Oral, resp. rate 16, height 5\' 10"  (1.778 m), weight 87.544 kg (193 lb).Body mass index is 27.69 kg/(m^2).  General Appearance: Casual and Fairly Groomed  Eye Contact::  Good  Speech:  Slurred some  Volume:  Normal  Mood:  Anxious and Worried  Affect:  Flat  Thought Process:  Coherent and Goal Directed  Orientation:  Full (Time, Place, and Person)  Thought Content:  Rumination and denies hallucinations.  Suicidal Thoughts:  No  Homicidal Thoughts:  No  Memory:  Immediate;   Good Recent;   Good Remote;   Good  Judgement:  Fair  Insight:  Good  Psychomotor Activity:  Anxious, worried, mild tremors  Concentration:  Fair  Recall:  Good  Akathisia:  No  Handed:  Right  AIMS (if indicated):     Assets:  Desire for Improvement  Sleep:  Number of Hours: 6.75   Current Medications: Current Facility-Administered Medications  Medication Dose Route Frequency Provider Last Rate Last Dose  . acetaminophen (TYLENOL) tablet 650 mg  650 mg Oral Q6H PRN Sanjuana Kava, NP      . albuterol (PROVENTIL HFA;VENTOLIN HFA) 108 (90 BASE) MCG/ACT inhaler 2 puff  2 puff Inhalation Q4H PRN Sanjuana Kava, NP      . alum & mag hydroxide-simeth (MAALOX/MYLANTA) 200-200-20 MG/5ML suspension 30 mL  30 mL Oral Q4H PRN Sanjuana Kava, NP      . aspirin EC tablet 81 mg  81 mg Oral Daily Rachael Fee, MD   81 mg  at 10/01/12 0800  . chlordiazePOXIDE (LIBRIUM) capsule 25 mg  25 mg Oral Q6H PRN Sanjuana Kava, NP   25 mg at 09/30/12 2118  . chlordiazePOXIDE (LIBRIUM) capsule 25 mg  25 mg Oral BH-qamhs Sanjuana Kava, NP       Followed by  . [START ON 10/03/2012] chlordiazePOXIDE (LIBRIUM) capsule 25 mg  25 mg Oral Daily Sanjuana Kava, NP      . clopidogrel (PLAVIX) tablet 75 mg  75 mg Oral Daily Sanjuana Kava, NP   75 mg at 10/01/12 0759  . hydrochlorothiazide (HYDRODIURIL) tablet 25 mg  25 mg Oral Daily Sanjuana Kava, NP   25 mg at 10/01/12 0759  . hydrOXYzine (ATARAX/VISTARIL) tablet 25 mg  25 mg Oral Q6H PRN Sanjuana Kava, NP      . lisinopril (PRINIVIL,ZESTRIL) tablet 20 mg  20 mg Oral Daily Sanjuana Kava, NP   20 mg at 10/01/12 0759  . loperamide (IMODIUM) capsule 2-4 mg  2-4 mg Oral PRN Sanjuana Kava, NP      . magnesium hydroxide (MILK OF MAGNESIA) suspension 30 mL  30 mL Oral Daily PRN Sanjuana Kava, NP   30 mL at 09/29/12 1315  . metoprolol succinate (TOPROL-XL) 24 hr tablet 100 mg  100 mg Oral Daily Sanjuana Kava, NP   100 mg at 10/01/12 0759  . multivitamin with minerals tablet 1 tablet  1 tablet Oral Daily Sanjuana Kava, NP   1 tablet at 10/01/12 0759  . nicotine (NICODERM CQ - dosed in mg/24 hours) patch 21 mg  21 mg Transdermal Q0600 Sanjuana Kava, NP   21 mg at 10/01/12 1610  . ondansetron (ZOFRAN-ODT) disintegrating tablet 4 mg  4 mg Oral Q6H PRN Sanjuana Kava, NP      . simvastatin (ZOCOR) tablet 20 mg  20 mg Oral q1800 Sanjuana Kava, NP   20 mg at 09/30/12 1723  . thiamine (B-1) injection 100 mg  100 mg Intramuscular Once Sanjuana Kava, NP      . thiamine (VITAMIN B-1) tablet 100 mg  100 mg Oral Daily Sanjuana Kava, NP   100 mg at 10/01/12 0800    Lab Results: No results found for this or any previous visit (from the past 48 hour(s)).  Physical Findings: AIMS: Facial and Oral Movements Muscles of Facial Expression: None, normal Lips and Perioral Area: None, normal Jaw: None, normal Tongue: None, normal,Extremity Movements Upper (arms, wrists, hands, fingers): None, normal Lower (legs, knees, ankles, toes): None, normal, Trunk Movements Neck, shoulders, hips: None, normal, Overall Severity Severity of abnormal movements (highest score from questions above): None, normal Incapacitation due to abnormal movements: None, normal Patient's awareness of abnormal movements (rate only patient's report): No Awareness, Dental Status Current problems with teeth and/or dentures?: No Does patient usually wear dentures?: No  CIWA:  CIWA-Ar Total: 1 COWS:  COWS Total Score: 0  Treatment Plan  Summary: Daily contact with patient to assess and evaluate symptoms and progress in treatment Medication management Work on finding appropriate therapeutic placement  Plan: Supportive approach/coping skills/relapse prevention. Encouraged out of room, participation in group sessions and application of coping skills when distressed. Will continue to monitor response to/adverse effects of medications in use to assure effectiveness. Continue to monitor mood, behavior and interaction with staff and other patients. Continue current plan of care.  Medical Decision Making Problem Points:  Established problem, stable/improving (1), Review of last therapy session (1)  and Review of psycho-social stressors (1) Data Points:  Review and summation of old records (2) Review of medication regiment & side effects (2)  I certify that inpatient services furnished can reasonably be expected to improve the patient's condition.   Armandina Stammer I 10/01/2012, 12:09 PM  Discussed with provider and agree with above notes.  Jacqulyn Cane, M.D.  10/01/2012 11:03 PM

## 2012-10-01 NOTE — Progress Notes (Signed)
Adult Psychoeducational Group Note  Date:  10/01/2012 Time:  6:53 PM  Group Topic/Focus:  Identifying Needs:   The focus of this group is to help patients identify their personal needs that have been historically problematic and identify healthy behaviors to address their needs.  Participation Level:  Minimal  Participation Quality:  Appropriate and Attentive  Affect:  Appropriate and Flat  Cognitive:  Alert and Appropriate  Insight: Appropriate and Good  Engagement in Group:  Improving  Modes of Intervention:  Activity and Education    Dalia Heading 10/01/2012, 6:53 PM

## 2012-10-01 NOTE — Progress Notes (Signed)
Patient did not attend the evening speaker AA meeting. Pt was notified that group was beginning but remained in bed.   

## 2012-10-01 NOTE — Progress Notes (Signed)
Adult Psychoeducational Group Note  Date:  10/01/2012 Time:  9:14 AM  Group Topic/Focus:  Self-Inventory  Participation Level:  Minimal  Participation Quality:  Attentive  Affect:  Depressed and Flat  Cognitive:  Appropriate  Insight: Appropriate  Engagement in Group:  Limited  Modes of Intervention:  Clarification, Explanation, Orientation, Support, Rapport building   Additional Comments:  Pt attended group but did not share or speak out in group, pt was calm and attentive and did not have any questions or concerns at this time.   Alfonse Spruce 10/01/2012, 9:14 AM

## 2012-10-02 MED ORDER — LISINOPRIL 20 MG PO TABS
20.0000 mg | ORAL_TABLET | Freq: Once | ORAL | Status: AC
  Administered 2012-10-02: 20 mg via ORAL

## 2012-10-02 MED ORDER — HYDROXYZINE HCL 25 MG PO TABS
25.0000 mg | ORAL_TABLET | Freq: Four times a day (QID) | ORAL | Status: DC | PRN
  Filled 2012-10-02: qty 16

## 2012-10-02 MED ORDER — LISINOPRIL 40 MG PO TABS
40.0000 mg | ORAL_TABLET | Freq: Every day | ORAL | Status: DC
  Administered 2012-10-03 – 2012-10-05 (×3): 40 mg via ORAL
  Filled 2012-10-02 (×5): qty 1

## 2012-10-02 MED ORDER — TRAZODONE HCL 50 MG PO TABS
50.0000 mg | ORAL_TABLET | Freq: Every evening | ORAL | Status: DC | PRN
  Administered 2012-10-02: 50 mg via ORAL
  Filled 2012-10-02: qty 1

## 2012-10-02 NOTE — Progress Notes (Addendum)
Patient ID: Mitchell White, male   DOB: [birth year], 57 y.o.   MRN: [id number] Patient ID: Mitchell White, male   DOB: 01-Oct-1955, 57 y.o.   MRN: [id number] [facility] MD Progress Note  10/02/2012 11:34 AM [name]  MRN:  [id number]  Subjective:  Mrk reports today that he is feeling a little better. However, did not sleep well because his roommate kept him up most of the night. He states that his roommate talked in his sleep, tossed and turned all night long. He adds that he is home sick, however, has to take care of his alcoholism. He reports that his old lady told him that the people that were making the home made white liquor that he was drinking has been bursted as of yesterday. He says that he does not need to worry about drinking any more since the suppliers are heading to jail. Still would like to go to a treatment program facility if they are willing to give him a chance considering his state of health".  Diagnosis:   Axis I: Alcohol abuse with intoxication, alcohol dependence, Cocaine abuse Axis II: Deferred Axis III:  Past Medical History  Diagnosis Date  . Mental disorder   . Coronary artery disease   . Asthma   . Stroke   . Hypertension    Axis IV: other psychosocial or environmental problems and Substance abuse issues Axis V: 41-50 serious symptoms  ADL's:  Intact  Sleep: Fair  Appetite:  Fair  Suicidal Ideation:  Plan:  No Intent:  No Means:  No Homicidal Ideation:  Plan:  No Intent:  No Means:  No  AEB (as evidenced by): Per patient's reports  Psychiatric Specialty Exam: Review of Systems  Constitutional: Negative.   HENT: Negative.   Eyes: Negative.   Respiratory: Negative.   Cardiovascular: Negative.   Gastrointestinal: Negative.   Genitourinary: Negative.   Musculoskeletal:       Left sides weakness. Ambulates with walker prn.     Skin: Negative.   Neurological: Negative.   Endo/Heme/Allergies: Negative.   Psychiatric/Behavioral: Positive for  substance abuse (Hx alcoholism). Negative for depression, suicidal ideas, hallucinations and memory loss. The patient is nervous/anxious (Currently being stabilized with medication.). The patient does not have insomnia.     Blood pressure 181/107, pulse 62, temperature 96.5 F (35.8 C), temperature source Oral, resp. rate 24, height 5\' 10"  (1.778 m), weight 87.544 kg (193 lb).Body mass index is 27.69 kg/(m^2).  General Appearance: Casual and Fairly Groomed  Eye Contact::  Good  Speech:  Slurred some  Volume:  Normal  Mood:  Anxious and Worried  Affect:  Flat  Thought Process:  Coherent and Goal Directed  Orientation:  Full (Time, Place, and Person)  Thought Content:  Rumination and denies hallucinations.  Suicidal Thoughts:  No  Homicidal Thoughts:  No  Memory:  Immediate;   Good Recent;   Good Remote;   Good  Judgement:  Fair  Insight:  Good  Psychomotor Activity:  Anxious, worried, mild tremors  Concentration:  Fair  Recall:  Good  Akathisia:  No  Handed:  Right  AIMS (if indicated):     Assets:  Desire for Improvement  Sleep:  Number of Hours: 2.5   Current Medications: Current Facility-Administered Medications  Medication Dose Route Frequency Provider Last Rate Last Dose  . acetaminophen (TYLENOL) tablet 650 mg  650 mg Oral Q6H PRN Sanjuana Kava, NP      . albuterol (PROVENTIL HFA;VENTOLIN HFA) 108 (90 BASE) MCG/ACT inhaler  2 puff  2 puff Inhalation Q4H PRN Sanjuana Kava, NP      . alum & mag hydroxide-simeth (MAALOX/MYLANTA) 200-200-20 MG/5ML suspension 30 mL  30 mL Oral Q4H PRN Sanjuana Kava, NP      . aspirin EC tablet 81 mg  81 mg Oral Daily Rachael Fee, MD   81 mg at 10/02/12 0801  . [START ON 10/03/2012] chlordiazePOXIDE (LIBRIUM) capsule 25 mg  25 mg Oral Daily Sanjuana Kava, NP      . clopidogrel (PLAVIX) tablet 75 mg  75 mg Oral Daily Sanjuana Kava, NP   75 mg at 10/02/12 0801  . hydrochlorothiazide (HYDRODIURIL) tablet 25 mg  25 mg Oral Daily Sanjuana Kava, NP    25 mg at 10/02/12 0648  . lisinopril (PRINIVIL,ZESTRIL) tablet 20 mg  20 mg Oral Once Sanjuana Kava, NP      . Melene Muller ON 10/03/2012] lisinopril (PRINIVIL,ZESTRIL) tablet 40 mg  40 mg Oral Daily Sanjuana Kava, NP      . magnesium hydroxide (MILK OF MAGNESIA) suspension 30 mL  30 mL Oral Daily PRN Sanjuana Kava, NP   30 mL at 09/29/12 1315  . metoprolol succinate (TOPROL-XL) 24 hr tablet 100 mg  100 mg Oral Daily Sanjuana Kava, NP   100 mg at 10/02/12 0648  . multivitamin with minerals tablet 1 tablet  1 tablet Oral Daily Sanjuana Kava, NP   1 tablet at 10/02/12 0801  . nicotine (NICODERM CQ - dosed in mg/24 hours) patch 21 mg  21 mg Transdermal Q0600 Sanjuana Kava, NP   21 mg at 10/02/12 0600  . simvastatin (ZOCOR) tablet 20 mg  20 mg Oral q1800 Sanjuana Kava, NP   20 mg at 10/01/12 1717  . thiamine (B-1) injection 100 mg  100 mg Intramuscular Once Sanjuana Kava, NP      . thiamine (VITAMIN B-1) tablet 100 mg  100 mg Oral Daily Sanjuana Kava, NP   100 mg at 10/02/12 0801    Lab Results: No results found for this or any previous visit (from the past 48 hour(s)).  Physical Findings: AIMS: Facial and Oral Movements Muscles of Facial Expression: None, normal Lips and Perioral Area: None, normal Jaw: None, normal Tongue: None, normal,Extremity Movements Upper (arms, wrists, hands, fingers): None, normal Lower (legs, knees, ankles, toes): None, normal, Trunk Movements Neck, shoulders, hips: None, normal, Overall Severity Severity of abnormal movements (highest score from questions above): None, normal Incapacitation due to abnormal movements: None, normal Patient's awareness of abnormal movements (rate only patient's report): No Awareness, Dental Status Current problems with teeth and/or dentures?: No Does patient usually wear dentures?: No  CIWA:  CIWA-Ar Total: 0 COWS:  COWS Total Score: 0  Treatment Plan Summary: Daily contact with patient to assess and evaluate symptoms and progress in  treatment Medication management Work on finding appropriate therapeutic placement  Plan: Supportive approach/coping skills/relapse prevention. Increased Lisinopril from 20 mg to 40 mg daily. Encouraged out of room, participation in group sessions and application of coping skills when distressed. Will continue to monitor response to/adverse effects of medications in use to assure effectiveness. Continue to monitor mood, behavior and interaction with staff and other patients. Continue current plan of care.  Medical Decision Making Problem Points:  Established problem, stable/improving (1), Review of last therapy session (1) and Review of psycho-social stressors (1) Data Points:  Review and summation of old records (2) Review of medication regiment &  side effects (2)  I certify that inpatient services furnished can reasonably be expected to improve the patient's condition.   Sanjuana Kava, FNP, PMHNP 10/02/2012, 11:34 AM  Discussed with provider and agree with above notes.  Jacqulyn Cane, M.D.  10/02/2012 11:34 AM

## 2012-10-02 NOTE — Progress Notes (Signed)
D.  Pt pleasant on approach, requests medication to help him sleep since he did not sleep well last night.  Positive for evening group.  Interacting appropriately within milieu.  Pt did not use walker to ambulate this evening, was hanging onto side rails.  Denies SI/HI/hallucinations at this time.  A. Stressed fall safety and use of walker, support and encouragement offered  R.  Pt remains safe, resting in bed.  Will continue to monitor.

## 2012-10-02 NOTE — Progress Notes (Signed)
Adult Psychoeducational Group Note  Date:  10/02/2012 Time:  1515  Group Topic/Focus:  Making Healthy Choices:   The focus of this group is to help patients identify negative/unhealthy choices they were using prior to admission and identify positive/healthier coping strategies to replace them upon discharge.  Participation Level:  Minimal  Participation Quality:  Drowsy  Affect:  Flat  Cognitive:  Oriented  Insight: Limited  Engagement in Group:  Off Topic  Modes of Intervention:    Additional Comments:  Pt was not engaging in group, pt was off topic throughout group but was able to answer at least one question.  Casilda Carls 10/02/2012, 4:25 PM

## 2012-10-02 NOTE — Progress Notes (Signed)
[facility] Group Notes:  (Nursing/MHT/Case Management/Adjunct)  Date:  10/02/2012  Time:  10:52 AM  Type of Therapy:  Psychoeducational Skills  Participation Level:  Minimal  Participation Quality:  Appropriate and Drowsy  Affect:  Appropriate, Flat and Lethargic  Cognitive:  Appropriate  Insight:  Good  Engagement in Group:  Lacking  Modes of Intervention:  Discussion and Support  Summary of Progress/Problems:  Pts participated in non-denominational spirituality group by reading "Ending the Cycle" from the Sunday workbook. Pts read through paragraph by paragraph out loud then discussed items that they felt were relevant to them. Pts participation was limited even after pt was prompted to particpate.    Dalia Heading 10/02/2012, 10:52 AM

## 2012-10-02 NOTE — Progress Notes (Signed)
Patient did attend the AA meeting. 

## 2012-10-02 NOTE — Progress Notes (Signed)
Adult Psychoeducational Group Note  Date:  10/02/2012 Time:  1300  Group Topic/Focus:  Healthy Support Systems  Participation Level:  None  Participation Quality:  Drowsy and Inattentive  Affect:  Blunted  Cognitive:  Appropriate  Insight: None  Engagement in Group:  None  Modes of Intervention:  Discussion and Education  Additional Comments:  Pt seemed disinterested in group activity and lethargic.  Zeven Kocak Shari Prows 10/02/2012, 1:37 PM

## 2012-10-02 NOTE — Clinical Social Work Note (Signed)
[facility] Group Notes:  (Clinical Social Work)  10/02/2012  10:00-11:00AM  Summary of Progress/Problems:   The main focus of today's process group was to   identify the patient's current support system and decide on other supports that can be put in place to prevent future hospitalizations.  A handout was used to explain the 4 definitions/levels of support and to think about what support patient has given and received from others.  An emphasis was placed on using counselor, doctor, therapy groups, 12-step groups, and problem-specific support groups to expand supports. The patient expressed that his girlfriend and mother are supportive.  He gave little indication that he is willing to remain sober, saying repeatedly that it does not matter how much people support him, nobody is going to keep him from drinking.  Type of Therapy:  Process Group with Motivational Interviewing  Participation Level:  Minimal  Participation Quality:  Drowsy  Affect:  Blunted  Cognitive:  Lacking  Insight:  Limited  Engagement in Therapy:  Limited  Modes of Intervention:  Clarification, Education, Limit-setting, Problem-solving, Socialization, Support and Processing, Exploration, Discussion, Role-Play   Ambrose Mantle, LCSW 10/02/2012, 12:00 PM

## 2012-10-02 NOTE — Progress Notes (Signed)
D   Pt is pleasant on approach  He denies withdrawal symptoms but does have a tremor   He said he was afraid to go home that he has a lot to face when he leaves here   He attends and participates in groups  He uses a walker because he had a stroke and has some weakness A   Verbal support given  Medications administered and effectiveness monitored  Q 15 min checks R   Pt safe at present

## 2012-10-02 NOTE — [facility] Counselor (Signed)
Adult Comprehensive Assessment  Patient ID: Mitchell White, male   DOB: Nov 25, 1955, 57 y.o.   MRN: [id number]  Information Source: Information source: Patient  Current Stressors:  Educational / Learning stressors: Denies Employment / Job issues: Denies, on disability Family Relationships: Denies Surveyor, quantity / Lack of resources (include bankruptcy): Taxes are due and he does not have the money.  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 Housing / Lack of housing: Denies Physical health (include injuries & life threatening diseases): 3 strokes, uses a cane Social relationships: Knows a lot of people, but does not deal with them because he would get in trouble. Substance abuse: He is tired of drinking and using rock cocaine. Bereavement / Loss: Brother was killed in a car accident 3-4 years ago.  Living/Environment/Situation:  Living Arrangements: Non-relatives/Friends (Girlfriend) Living conditions (as described by patient or guardian): Safe and clean How long has patient lived in current situation?: 13 years What is atmosphere in current home: Loving;Comfortable  Family History:  Marital status: Long term relationship Long term relationship, how long?: 13 years with girlfriend What types of issues is patient dealing with in the relationship?: Denies having any issues Does patient have children?: No  Childhood History:  By whom was/is the patient raised?: Both parents Description of patient's relationship with caregiver when they were a child: Okay Patient's description of current relationship with people who raised him/her: Father is deceased; Mother is his buddy Does patient have siblings?: Yes Number of Siblings: 4 (2 sisters, 2 brothers (one deceased)) Description of patient's current relationship with siblings: Alright.  Brother put a business in patient's name -- they have a falling out sometimes, but are working through  it. Did patient suffer any verbal/emotional/physical/sexual abuse as a child?: No Did patient suffer from severe childhood neglect?: No Has patient ever been sexually abused/assaulted/raped as an adolescent or adult?: No Was the patient ever a victim of a crime or a disaster?: No Witnessed domestic violence?: No Has patient been effected by domestic violence as an adult?: No  Education:  Highest grade of school patient has completed: 12 Currently a student?: No Learning disability?: Yes What learning problems does patient have?: Special education classes  Employment/Work Situation:   Employment situation: On disability Why is patient on disability: 3 strokes How long has patient been on disability: 3 years What is the longest time patient has a held a job?: 12-13 years 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 Has alcohol/substance abuse ever caused legal problems?: Yes (DWI)  Social Support System:   Patient's Community Support System: Fair Museum/gallery exhibitions officer System: Sisters, mother, brother, girlfriend -- they all get on him for drinking. Type of faith/religion: [employer] How does patient's faith help to cope with current illness?: Joined church about 3 weeks ago  Leisure/Recreation:   Leisure and Hobbies: Fish, play dominoes, play spades, pitching horseshoes  Strengths/Needs:   What things does the patient  do  well?: Pitching horseshoes, good boyfriend, good to girfriend's children, good son, good brother In what areas does patient struggle / problems for patient: Drinking, getting along with certain people  Discharge Plan:   Does patient have access to transportation?: Yes Will patient be returning to same living situation after discharge?: Yes Currently receiving community mental health services: No If no, would patient like referral for services when discharged?: No (States does not want services because does not have money.) Does patient have financial barriers related to discharge medications?: Yes Patient description of barriers related to discharge medications: "It's kind of hard on me, because the medicine is $57 already."  Summary/Recommendations:   Summary and Recommendations (to be completed by the evaluator): This is a 57yo African-American male who was admitted for detoxification from alcohol.  He was previously in a detox program many years ago, occasionally goes to [facility] but not often.  He was drinking 1 pint to 1 quart of white liquor daily, has been drinking since age 57. No SI, HI or psychosis.  Reports previous detox at [facility], and [facility] in 2008.  Has had 6 DUIs, has a court date 10/11/2012.  He is on disability from having 3 strokes, walks with a cane or walker.  Lives with his girlfriend of 13 years.  He states he does not want follow-up services because does not have the money.  He would benefit from safety monitoring, medication evaluation, psychoeducation, group therapy, and discharge planning to link with ongoing resources.    [name]. 10/02/2012

## 2012-10-03 MED ORDER — MIRTAZAPINE 15 MG PO TABS
15.0000 mg | ORAL_TABLET | Freq: Every day | ORAL | Status: DC
  Administered 2012-10-03 – 2012-10-04 (×2): 15 mg via ORAL
  Filled 2012-10-03: qty 4
  Filled 2012-10-03 (×4): qty 1

## 2012-10-03 NOTE — Progress Notes (Signed)
Adult Psychoeducational Group Note  Date:  10/03/2012 Time:  3:52 PM  Group Topic/Focus:  Self Care:   The focus of this group is to help patients understand the importance of self-care in order to improve or restore emotional, physical, spiritual, interpersonal, and financial health.  Participation Level:  Did Not Attend  Additional Comments:  Pt was unable to attend group due to being unsteady upon ambulation this morning.    Reinaldo Raddle K 10/03/2012, 3:52 PM

## 2012-10-03 NOTE — Progress Notes (Signed)
[facility] LCSW Aftercare Discharge Planning Group Note  10/03/2012 8:45 AM  Participation Quality:  Did not attend per RN decision as patient was weak    [name], Mitchell Payer

## 2012-10-03 NOTE — Progress Notes (Signed)
[facility] LCSW Group Therapy  10/03/2012 1:15 PM Type of Therapy:  Group Therapy  Participation Level:  Did Not Attend; still not feeling well    Clide Dales 10/03/2012, 4:55 PM

## 2012-10-03 NOTE — Progress Notes (Signed)
[facility] MD Progress Note  10/03/2012 5:49 PM Mcgwire Dasaro  MRN:  [id number] Subjective:  Mitchell [name] is not sleeping. He was given medication last night that he thinks made him sedated in the morning. He has the residual deficits after his stroke. He is going to go back home when he is ready to be discahrged. States his "old lady caters to him,"  helps him. He is committed to abstinence.  Diagnosis:  Alcohol Dependence, Cocaine Abuse  ADL's:  Intact  Sleep: Poor  Appetite:  Fair  Suicidal Ideation:  Plan:  denies Intent:  denies Means:  denies Homicidal Ideation:  Plan:  denies Intent:  denies Means:  denies AEB (as evidenced by):  Psychiatric Specialty Exam: Review of Systems  Constitutional: Negative.   HENT: Negative.   Eyes: Negative.   Respiratory: Negative.   Cardiovascular: Negative.   Gastrointestinal: Negative.   Genitourinary: Negative.   Musculoskeletal: Negative.   Skin: Negative.   Neurological: Positive for focal weakness.       S/P stroke X 3  Endo/Heme/Allergies: Negative.   Psychiatric/Behavioral: Positive for depression and substance abuse. The patient is nervous/anxious and has insomnia.     Blood pressure 153/88, pulse 64, temperature 97.6 F (36.4 C), temperature source Oral, resp. rate 20, height 5\' 10"  (1.778 m), weight 87.544 kg (193 lb).Body mass index is 27.69 kg/(m^2).  General Appearance: Fairly Groomed  Patent attorney::  Fair  Speech:  Slow and not spontaneous  Volume:  Decreased  Mood:  worried  Affect:  Restricted  Thought Process:  Coherent and Goal Directed  Orientation:  Full (Time, Place, and Person)  Thought Content:  worries  Suicidal Thoughts:  No  Homicidal Thoughts:  No  Memory:  Immediate;   Fair Recent;   Fair Remote;   Fair  Judgement:  Fair  Insight:  Present  Psychomotor Activity:  Decreased  Concentration:  Fair  Recall:  Fair  Akathisia:  No  Handed:  Right  AIMS (if indicated):     Assets:  Desire for Improvement   Sleep:  Number of Hours: 3.75   Current Medications: Current Facility-Administered Medications  Medication Dose Route Frequency Provider Last Rate Last Dose  . acetaminophen (TYLENOL) tablet 650 mg  650 mg Oral Q6H PRN Sanjuana Kava, NP      . albuterol (PROVENTIL HFA;VENTOLIN HFA) 108 (90 BASE) MCG/ACT inhaler 2 puff  2 puff Inhalation Q4H PRN Sanjuana Kava, NP      . alum & mag hydroxide-simeth (MAALOX/MYLANTA) 200-200-20 MG/5ML suspension 30 mL  30 mL Oral Q4H PRN Sanjuana Kava, NP      . aspirin EC tablet 81 mg  81 mg Oral Daily Rachael Fee, MD   81 mg at 10/03/12 0753  . clopidogrel (PLAVIX) tablet 75 mg  75 mg Oral Daily Sanjuana Kava, NP   75 mg at 10/03/12 0752  . hydrochlorothiazide (HYDRODIURIL) tablet 25 mg  25 mg Oral Daily Sanjuana Kava, NP   25 mg at 10/03/12 7829  . hydrOXYzine (ATARAX/VISTARIL) tablet 25 mg  25 mg Oral Q6H PRN Sanjuana Kava, NP      . lisinopril (PRINIVIL,ZESTRIL) tablet 40 mg  40 mg Oral Daily Sanjuana Kava, NP   40 mg at 10/03/12 0753  . magnesium hydroxide (MILK OF MAGNESIA) suspension 30 mL  30 mL Oral Daily PRN Sanjuana Kava, NP   30 mL at 09/29/12 1315  . metoprolol succinate (TOPROL-XL) 24 hr tablet 100 mg  100  mg Oral Daily Sanjuana Kava, NP   100 mg at 10/03/12 1308  . multivitamin with minerals tablet 1 tablet  1 tablet Oral Daily Sanjuana Kava, NP   1 tablet at 10/03/12 0752  . nicotine (NICODERM CQ - dosed in mg/24 hours) patch 21 mg  21 mg Transdermal Q0600 Sanjuana Kava, NP   21 mg at 10/03/12 0544  . simvastatin (ZOCOR) tablet 20 mg  20 mg Oral q1800 Sanjuana Kava, NP   20 mg at 10/03/12 1740  . thiamine (B-1) injection 100 mg  100 mg Intramuscular Once Sanjuana Kava, NP      . thiamine (VITAMIN B-1) tablet 100 mg  100 mg Oral Daily Sanjuana Kava, NP   100 mg at 10/03/12 0752  . traZODone (DESYREL) tablet 50 mg  50 mg Oral QHS PRN,MR X 1 Sanjuana Kava, NP   50 mg at 10/02/12 2137    Lab Results: No results found for this or any previous visit  (from the past 48 hour(s)).  Physical Findings: AIMS: Facial and Oral Movements Muscles of Facial Expression: None, normal Lips and Perioral Area: None, normal Jaw: None, normal Tongue: None, normal,Extremity Movements Upper (arms, wrists, hands, fingers): None, normal Lower (legs, knees, ankles, toes): None, normal, Trunk Movements Neck, shoulders, hips: None, normal, Overall Severity Severity of abnormal movements (highest score from questions above): None, normal Incapacitation due to abnormal movements: None, normal Patient's awareness of abnormal movements (rate only patient's report): No Awareness, Dental Status Current problems with teeth and/or dentures?: No Does patient usually wear dentures?: No  CIWA:  CIWA-Ar Total: 0 COWS:  COWS Total Score: 0  Treatment Plan Summary: Daily contact with patient to assess and evaluate symptoms and progress in treatment Medication management  Plan: Complete there detox            Reassess BP control            Remeron 15 mg HS Medical Decision Making Problem Points:  Review of psycho-social stressors (1) Data Points:  Review of medication regiment & side effects (2)  I certify that inpatient services furnished can reasonably be expected to improve the patient's condition.   Jerlisa Diliberto A 10/03/2012, 5:49 PM

## 2012-10-03 NOTE — Care Management Utilization Note (Signed)
   Per State Regulation 482.30  This chart was reviewed for necessity with respect to the patient's Admission/ Duration of stay.  Next review date: 10/06/12  Nicolasa Ducking RN, BSN

## 2012-10-03 NOTE — Progress Notes (Signed)
Patient ID: Mitchell [name], male   DOB: 05/25/1956, 57 y.o.   MRN: [id number] D: Pt. Lying in bed, up ad lib to BR. Pt. Reports "going home in two days" Pt. Reports won't be using any drugs "the people next door where I was getting it got busted" Pt. Laughs, then says "I don't want it no more" A: Clinical research associate introduced self to client, encouraged him to move forward in treatment. Staff will monitor q30min for safety. Writer encouraged group. R: Pt. Is safe on the unit. Pt. Refused group.

## 2012-10-04 NOTE — Progress Notes (Signed)
Due to weather social workers were not able to host 0845 after care planning group. Psycho educational group was held at 0845 and after care planning group is going to be held at 1100. This psycho educational group consisted of using the therapeutic question ball to describe positive situations and explaining short and long term goals. Writer was able to incorporate recovery (the topic of the day) in the group by explaining the importance of recovery and setting reasonable goal for self to become the better person that they want themselves to be.

## 2012-10-04 NOTE — Progress Notes (Signed)
[facility] LCSW Group Therapy  10/04/2012 1:15 PM  Type of Therapy:  Group Therapy  Participation Level:  Minimal  Participation Quality:  Patient unable to participate due to inability to stay awake and focused.  CSW requested patient's nurse and MHT escort patient to his room.    Clide Dales 10/04/2012, 2:35 PM

## 2012-10-04 NOTE — Progress Notes (Signed)
Pt reports his sleep as fair but remains drowsy during the day.  He had a medication change yesterday from trazodone to remeron but still feels the medication is making him to sleepy. He didn't go to any groups today and the ones he does attend he falls asleep. He rated his depression a 6 denied any hopelessness or anxity.  He denied any S/Hideation or A/V hallucinations.

## 2012-10-04 NOTE — Progress Notes (Signed)
[facility] LCSW Aftercare Discharge Planning Group Note  10/04/2012 11:00  Participation Quality:  None, patient slept through group    Clide Dales 10/04/2012, 12:52 PM

## 2012-10-04 NOTE — Progress Notes (Signed)
Patient ID: Mitchell White, male   DOB: [birth year], 57 y.o.   MRN: [id number] [facility] MD Progress Note  10/04/2012 3:34 PM Mitchell White  MRN:  [id number]  Subjective:  Mitchell White is still endorsing drowsiness during the day, especially on waking up in the morning. Blamed it on the sleep medicine that he gets at night. He stated today that he would feel better if he can have a cigarette because he is craving it. He added that he will be going home after discharge to continue substance abuse prevention meetings on outpatient basis. He denies any suicidal, homicidal ideations, auditory visual hallucinations. Explained to patient that his new medication for sleep was started last night, and Trazodone has been discontinued, and give his body some time to get used to the medicine.  O: Mitchell White, continue to use walker for ambulation on PRN basis. He has left sided weakness.  Diagnosis:  Alcohol Dependence, Cocaine Abuse  ADL's:  Intact  Sleep: Fair  Appetite:  Fair  Suicidal Ideation:  Plan:  denies Intent:  denies Means:  denies Homicidal Ideation:  Plan:  denies Intent:  denies Means:  denies  AEB (as evidenced by):  Psychiatric Specialty Exam: Review of Systems  Constitutional: Negative.   HENT: Negative.   Eyes: Negative.   Respiratory: Negative.   Cardiovascular: Negative.   Gastrointestinal: Negative.   Genitourinary: Negative.   Musculoskeletal: Negative.        Ambulates with walker.   Skin: Negative.   Neurological: Positive for focal weakness.       S/P stroke X 3  Endo/Heme/Allergies: Negative.   Psychiatric/Behavioral: Positive for depression and substance abuse. The patient is nervous/anxious and has insomnia.     Blood pressure 181/79, pulse 75, temperature 98 F (36.7 C), temperature source Oral, resp. rate 18, height 5\' 10"  (1.778 m), weight 87.544 kg (193 lb).Body mass index is 27.69 kg/(m^2).  General Appearance: Fairly Groomed  Patent attorney::  Fair  Speech:  Slow  and not spontaneous  Volume:  Decreased  Mood:  worried  Affect:  Restricted  Thought Process:  Coherent and Goal Directed  Orientation:  Full (Time, Place, and Person)  Thought Content:  worries, denies hallucinations, delusions  Suicidal Thoughts:  No  Homicidal Thoughts:  No  Memory:  Immediate;   Fair Recent;   Fair Remote;   Fair  Judgement:  Fair  Insight:  Present  Psychomotor Activity:  Decreased  Concentration:  Fair  Recall:  Fair  Akathisia:  No  Handed:  Right  AIMS (if indicated):     Assets:  Desire for Improvement  Sleep:  Number of Hours: 5.75   Current Medications: Current Facility-Administered Medications  Medication Dose Route Frequency Provider Last Rate Last Dose  . acetaminophen (TYLENOL) tablet 650 mg  650 mg Oral Q6H PRN Sanjuana Kava, NP      . albuterol (PROVENTIL HFA;VENTOLIN HFA) 108 (90 BASE) MCG/ACT inhaler 2 puff  2 puff Inhalation Q4H PRN Sanjuana Kava, NP      . alum & mag hydroxide-simeth (MAALOX/MYLANTA) 200-200-20 MG/5ML suspension 30 mL  30 mL Oral Q4H PRN Sanjuana Kava, NP      . aspirin EC tablet 81 mg  81 mg Oral Daily Rachael Fee, MD   81 mg at 10/04/12 0810  . clopidogrel (PLAVIX) tablet 75 mg  75 mg Oral Daily Sanjuana Kava, NP   75 mg at 10/04/12 0810  . hydrochlorothiazide (HYDRODIURIL) tablet 25 mg  25 mg Oral  Daily Sanjuana Kava, NP   25 mg at 10/04/12 0810  . hydrOXYzine (ATARAX/VISTARIL) tablet 25 mg  25 mg Oral Q6H PRN Sanjuana Kava, NP      . lisinopril (PRINIVIL,ZESTRIL) tablet 40 mg  40 mg Oral Daily Sanjuana Kava, NP   40 mg at 10/04/12 0810  . magnesium hydroxide (MILK OF MAGNESIA) suspension 30 mL  30 mL Oral Daily PRN Sanjuana Kava, NP   30 mL at 09/29/12 1315  . metoprolol succinate (TOPROL-XL) 24 hr tablet 100 mg  100 mg Oral Daily Sanjuana Kava, NP   100 mg at 10/04/12 0810  . mirtazapine (REMERON) tablet 15 mg  15 mg Oral QHS Rachael Fee, MD   15 mg at 10/03/12 2207  . multivitamin with minerals tablet 1 tablet  1  tablet Oral Daily Sanjuana Kava, NP   1 tablet at 10/04/12 0810  . nicotine (NICODERM CQ - dosed in mg/24 hours) patch 21 mg  21 mg Transdermal Q0600 Sanjuana Kava, NP   21 mg at 10/04/12 0865  . simvastatin (ZOCOR) tablet 20 mg  20 mg Oral q1800 Sanjuana Kava, NP   20 mg at 10/03/12 1740  . thiamine (B-1) injection 100 mg  100 mg Intramuscular Once Sanjuana Kava, NP      . thiamine (VITAMIN B-1) tablet 100 mg  100 mg Oral Daily Sanjuana Kava, NP   100 mg at 10/04/12 7846    Lab Results: No results found for this or any previous visit (from the past 48 hour(s)).  Physical Findings: AIMS: Facial and Oral Movements Muscles of Facial Expression: None, normal Lips and Perioral Area: None, normal Jaw: None, normal Tongue: None, normal,Extremity Movements Upper (arms, wrists, hands, fingers): None, normal Lower (legs, knees, ankles, toes): None, normal, Trunk Movements Neck, shoulders, hips: None, normal, Overall Severity Severity of abnormal movements (highest score from questions above): None, normal Incapacitation due to abnormal movements: None, normal Patient's awareness of abnormal movements (rate only patient's report): No Awareness, Dental Status Current problems with teeth and/or dentures?: Yes (no teeth) Does patient usually wear dentures?: No  CIWA:  CIWA-Ar Total: 1 COWS:  COWS Total Score: 0  Treatment Plan Summary: Daily contact with patient to assess and evaluate symptoms and progress in treatment Medication management  Plan: Complete detox treatment. Informed patient that he does get nicotine patch to help with alcohol cravings Reassess BP control Continue Remeron 15 mg Q HS as ordered,   Medical Decision Making Problem Points:  Review of psycho-social stressors (1) Data Points:  Review of medication regiment & side effects (2)  I certify that inpatient services furnished can reasonably be expected to improve the patient's condition.   Armandina Stammer I 10/04/2012,  3:34 PM

## 2012-10-04 NOTE — Progress Notes (Signed)
Recreation Therapy Notes  Date: 03.18.2014 Time: 3:00pm Location: [facility] Art Room      Group Topic/Focus: Goal Setting  Participation Level: Did not attend   Jearl Klinefelter, LRT/CTRS  Jearl Klinefelter 10/04/2012 3:37 PM

## 2012-10-05 MED ORDER — PRAVASTATIN SODIUM 40 MG PO TABS: 40.0000 mg | ORAL_TABLET | Freq: Every day | ORAL | Status: AC

## 2012-10-05 MED ORDER — ASPIRIN 81 MG PO TABS: 81.0000 mg | ORAL_TABLET | Freq: Every day | ORAL | Status: AC

## 2012-10-05 MED ORDER — CLOPIDOGREL BISULFATE 75 MG PO TABS: 75.0000 mg | ORAL_TABLET | Freq: Every day | ORAL | Status: AC

## 2012-10-05 MED ORDER — ONE-DAILY MULTI VITAMINS PO TABS: 1.0000 | ORAL_TABLET | Freq: Every day | ORAL | Status: AC

## 2012-10-05 MED ORDER — HYDROCHLOROTHIAZIDE 25 MG PO TABS: 25.0000 mg | ORAL_TABLET | Freq: Every day | ORAL | Status: AC

## 2012-10-05 MED ORDER — MIRTAZAPINE 15 MG PO TABS: 15.0000 mg | ORAL_TABLET | Freq: Every day | ORAL | Status: AC

## 2012-10-05 MED ORDER — ALBUTEROL SULFATE HFA 108 (90 BASE) MCG/ACT IN AERS: 2.0000 | INHALATION_SPRAY | RESPIRATORY_TRACT | Status: AC | PRN

## 2012-10-05 MED ORDER — LISINOPRIL 40 MG PO TABS: 40.0000 mg | ORAL_TABLET | Freq: Every day | ORAL | Status: AC

## 2012-10-05 MED ORDER — METOPROLOL SUCCINATE ER 100 MG PO TB24: 100.0000 mg | ORAL_TABLET | Freq: Every day | ORAL | Status: AC

## 2012-10-05 MED ORDER — HYDROXYZINE HCL 25 MG PO TABS: 25.0000 mg | ORAL_TABLET | Freq: Four times a day (QID) | ORAL | Status: AC | PRN

## 2012-10-05 MED ORDER — METOPROLOL SUCCINATE ER 100 MG PO TB24: 100.0000 mg | ORAL_TABLET | Freq: Every day | ORAL | Status: DC

## 2012-10-05 NOTE — Discharge Summary (Signed)
Physician Discharge Summary Note  Patient:  Mitchell White is an 57 y.o., male MRN:  [id number] DOB:  Dec 15, 1955 Patient phone:  [phone] (home)  Patient address:   [address] [place] [address],   Date of Admission:  09/28/2012  Date of Discharge: 10/05/12  Reason for Admission:  Alcohol intoxication  Discharge Diagnoses: Principal Problem:   Alcohol abuse with intoxication Active Problems:   Alcohol dependence   Cocaine abuse  Review of Systems  Constitutional: Negative.   HENT: Negative.  Negative for neck pain.   Eyes: Negative.   Respiratory: Negative.   Cardiovascular: Negative.   Gastrointestinal: Negative.   Genitourinary: Negative.   Musculoskeletal: Positive for joint pain. Negative for myalgias, back pain and falls.       Ambulates with walker prn during his stay in this hospital due to left sided weakness.   Skin: Negative.   Neurological: Positive for focal weakness (Due to hx CVA).  Endo/Heme/Allergies: Negative.   Psychiatric/Behavioral: Positive for substance abuse (Hx Alcoholism ). Negative for depression, suicidal ideas, hallucinations and memory loss. The patient is nervous/anxious (Stabilized with medication prior to discharge) and has insomnia (Stabilized with medication prior to discharge).    Axis Diagnosis:   AXIS I:  Alcohol dependence, Cocaine abuse AXIS II:  Deferred AXIS III:   Past Medical History  Diagnosis Date  . Mental disorder   . Coronary artery disease   . Asthma   . Stroke   . Hypertension    AXIS IV:  other psychosocial or environmental problems and Alcoholism AXIS V:  63  Level of Care:  OP  Hospital Course:  This is an admission assessment for this 57 year old African-American male. Admitted to [facility] from [facility] with complaints of excessive alcohol drinking. Patient reports, "I went to the [facility] ED yesterday. My old lady drove me to the hospital. I had initially gone to the [facility] to get some help with my drinking, I  was sent to the hospital instead. I have been drinking too much alcohol since I was 16. It got worse 3-4 years ago. I got a lot of shit going on in my mind. Drinking alcohol makes them go away for a while. Alcoholism has caused me both personal and legal problems. I have been charged with DUI and sent to jail x 4 times. I have a pending court charges/date in April of this year for drunk driving. I drink a pint of white liquor daily, and I have been doing this for a long time. I am not depressed. I ain't crazy, or thinking about hurting me or no body else. I just need help with my drinking".   After admission assessment and evaluation, it was determined that Mr. Tungate will need detoxification treatment to stabilize his system of alcohol intoxication and to combat the withdrawal symptoms of alcohol. And his discharge plans included a referral/appointment to an outpatient to an outpatient clinic for continuation of medical care. And because Mr. Coster is being discharged on Remeron and other pertinent medications for other health issues, it will be apparent that he is followed and evaluated on a regular basis by a licensed provider. Mr. Riker was then started on Librium protocol for his alcohol detoxification. He was also enrolled in group counseling sessions and activities to learn coping skills that should help him after discharge to cope better, manage his substance abuse problems to maintain a much longer sobriety. He also was enrolled and attended AA/NA meetings being offered and held  on this unit. He has some previous and or identifiable medical conditions that required treatment and or monitoring. He received medication management for all those health issues as well. He was monitored closely for any potential problems that may arise as a result of and or during detoxification treatment. Patient tolerated his treatment regimen and detoxification treatment without any significant adverse effects and or reactions  presented.  Patient attended treatment team meeting this am and met with the team. His reason for admission, symptoms, substance abuse issues, response to treatment and discharge plans discussed. Patient endorsed that he is doing well and stable for discharge to pursue the next phase of his substance abuse treatment. It was then agreed upon that he will follow-up care at the [facility] on [address] in [place], [place] on 10/10/12 @ 09:50 am. The address, date, time and contact information for [facility] provided for patient in writing.  Mr. Maret was encouraged to join/attend AA/NA meetings being offered and held within his community. He is encouraged to get a trusted sponsor from the advise of others or from whomever within the AA meetings seems to make sense, and who has a proven track record, and will hold him responsible for his sobriety, and both expects and insists on his total  abstinence from alcohol. He must focus the first of each month on the speaker meetings where he will specifically look at how his life has been wrecked by drugs/alcohol and how his life has been similar to that of the speaker's life.   Mr. Cappiello is currently being discharged to his home with his wife. Upon discharge, patient adamantly denies suicidal, homicidal ideations, auditory, visual hallucinations, delusional thinking and or withdrawal symptoms. Patient left [facility] with all personal belongings in no apparent distress. He received 4 days worth samples of his discharge medications. Transportation per taxi cab, paid by [facility].  Consults:  psychiatry  Significant Diagnostic Studies:  labs: CBC with diff, CMP, UDS, Toxicology tests  Discharge Vitals:   Blood pressure 150/98, pulse 56, temperature 98 F (36.7 C), temperature source Oral, resp. rate 16, height 5\' 10"  (1.778 m), weight 87.544 kg (193 lb). Body mass index is 27.69 kg/(m^2). Lab Results:   No results found for this or any previous visit (from the  past 72 hour(s)).  Physical Findings: AIMS: Facial and Oral Movements Muscles of Facial Expression: None, normal Lips and Perioral Area: None, normal Jaw: None, normal Tongue: None, normal,Extremity Movements Upper (arms, wrists, hands, fingers): None, normal Lower (legs, knees, ankles, toes): None, normal, Trunk Movements Neck, shoulders, hips: None, normal, Overall Severity Severity of abnormal movements (highest score from questions above): None, normal Incapacitation due to abnormal movements: None, normal Patient's awareness of abnormal movements (rate only patient's report): No Awareness, Dental Status Current problems with teeth and/or dentures?: Yes (no teeth) Does patient usually wear dentures?: No  CIWA:  CIWA-Ar Total: 1 COWS:  COWS Total Score: 0  Psychiatric Specialty Exam: See Psychiatric Specialty Exam and Suicide Risk Assessment completed by Attending Physician prior to discharge.  Discharge destination:  Home  Is patient on multiple antipsychotic therapies at discharge:  No   Has Patient had three or more failed trials of antipsychotic monotherapy by history:  No  Recommended Plan for Multiple Antipsychotic Therapies: NA     Medication List    STOP taking these medications       NIFEdipine 30 MG 24 hr tablet  Commonly known as:  PROCARDIA-XL/ADALAT CC  TAKE these medications     Indication   albuterol 108 (90 BASE) MCG/ACT inhaler  Commonly known as:  PROVENTIL HFA;VENTOLIN HFA  Inhale 2 puffs into the lungs every 4 (four) hours as needed for wheezing or shortness of breath. For shortness of breath   Indication:  Asthma, Chronic Obstructive Lung Disease     aspirin 81 MG tablet  Take 1 tablet (81 mg total) by mouth daily. Blood thinner for heart health   Indication:  Blood thinner, CVC     clopidogrel 75 MG tablet  Commonly known as:  PLAVIX  Take 1 tablet (75 mg total) by mouth daily. For PAD, CAD   Indication:  Acute Coronary Syndrome,  Disease of the Peripheral Arteries, Stroke caused by a Blood Clot     hydrochlorothiazide 25 MG tablet  Commonly known as:  HYDRODIURIL  Take 1 tablet (25 mg total) by mouth daily. For Hypertension   Indication:  High Blood Pressure     hydrOXYzine 25 MG tablet  Commonly known as:  ATARAX/VISTARIL  Take 1 tablet (25 mg total) by mouth every 6 (six) hours as needed for anxiety.   Indication:  Anxiety associated with Organic Disease     lisinopril 40 MG tablet  Commonly known as:  PRINIVIL,ZESTRIL  Take 1 tablet (40 mg total) by mouth daily. For hypertension   Indication:  Severe Rapidly Progressing Hypertension     metoprolol succinate 100 MG 24 hr tablet  Commonly known as:  TOPROL-XL  Take 1 tablet (100 mg total) by mouth daily. Take with or immediately following a meal: For Hypertension, CAD.   Indication:  Heart Failure, High Blood Pressure, Ischemic Heart Disease     mirtazapine 15 MG tablet  Commonly known as:  REMERON  Take 1 tablet (15 mg total) by mouth at bedtime. For depression/sleep   Indication:  Trouble Sleeping, Major Depressive Disorder     multivitamin tablet  Take 1 tablet by mouth daily. For vitamin deficiency   Indication:  For vitamin deficiency     pravastatin 40 MG tablet  Commonly known as:  PRAVACHOL  Take 1 tablet (40 mg total) by mouth at bedtime. For high cholesterol control   Indication:  Inherited Heterozygous Hypercholesterolemia, Type II A Hyperlipidemia, Increased Fats, Triglycerides & Cholesterol in the Blood       Follow-up Information   Follow up with [facility]  On 10/10/2012. (Appointment is at 9:50 on Monday 3/24 for medication management)    Contact information:   [address] [facility] [place]  Phone: [phone]  Fax: [phone]       Follow-up recommendations: Activity:  As tolerated Diet: As recommended by your primary care doctor. Keep all scheduled follow-up appointments as recommended.   Follow up relapse prevention  plan Comments:  Take all your medications as prescribed by your mental healthcare provider. Report any adverse effects and or reactions from your medicines to your outpatient provider promptly. Patient is instructed and cautioned to not engage in alcohol and or illegal drug use while on prescription medicines. In the event of worsening symptoms, patient is instructed to call the crisis hotline, 911 and or go to the nearest ED for appropriate evaluation and treatment of symptoms. Follow-up with your primary care provider for your other medical issues, concerns and or health care needs.   Total Discharge Time: Greater than 30 minutes    Signed: Armandina Stammer I 10/06/2012, 2:05 PM

## 2012-10-05 NOTE — Progress Notes (Signed)
[facility] LCSW Aftercare Discharge Planning Group Note  10/05/2012 3:58 PM  Participation Quality:  Appropriate  Affect:  Appropriate  Cognitive:  Alert and Oriented  Insight:  Developing/Improving  Engagement in Group:  Developing/Improving  Modes of Intervention:  Clarification, Exploration, Reality Testing and Support  Summary of Progress/Problems:  Pt denies both suicidal and homicidal ideation.  On a scale of 1 to 10 with ten being the most ever experienced, the patient rates depression at a 3 and anxiety at a 3. Patient reports he follows up at [facility] for medication management and has an appointment on 4/14/. CSW will call and schedule earlier appointment.    Clide Dales 10/05/2012, 3:58 PM

## 2012-10-05 NOTE — Progress Notes (Signed)
Patient ID: Mitchell White, male   DOB: 10/08/1955, 57 y.o.   MRN: [id number]  D:  Writer asked the pt about his day. Informed the writer that he was ready for discharge. Stated, "I'm ready to smoke a cig". "When will I be discharged?" Writer went over self eval with the pt, and asked why he rated his depression a six.  Stated, "I miss my nieces and nephews".   A:  Support and encouragement was offered. 15 min checks continued for safety.  R: Pt remains safe.

## 2012-10-05 NOTE — Tx Team (Signed)
  Interdisciplinary Treatment Plan Update (Adult)  Date: 10/05/2012  Time Reviewed: 9:42 AM   Progress in Treatment: Attending groups: Yes/No Participating in groups: Yes/No Taking medication as prescribed:  Yes/No Tolerating medication:  Yes/No Family/Significant othe contact made: Yes/No Patient understands diagnosis: Yes/No Discussing patient identified problems/goals with staff: Yes/No Medical problems stabilized or resolved:  Yes/No Denies suicidal/homicidal ideation: Yes/No Patient has not harmed self or Others: Yes/  New problem(s) identified: None Identified  Discharge Plan or Barriers:  CSW is setti ng up followup with patient's provider at [facility]  Additional comments: N/A  Reason for Continuation of Hospitalization: NA  Estimated length of stay: Discharge Today  For review of initial/current patient goals, please see plan of care.  Attendees: Patient:     Family:     Physician:  Geoffery Lyons 10/05/2012 9:42 AM   Nursing:  Roswell Miners, RN  10/05/2012 9:42 AM   Clinical Social Worker Ronda Fairly 10/05/2012 9:42 AM   Other:   Stephan Minister Liason 10/05/2012 9:42 AM   Other:  Concha Se, [employer] PA Student 10/05/2012 9:42 AM   Other:  Robbie Louis, RN 10/05/2012 9:42 AM   Other:   10/05/2012 9:42 AM    Scribe for Treatment Team:   Carney Bern, LCSWA  10/05/2012 9:42 AM

## 2012-10-05 NOTE — Progress Notes (Signed)
[facility] Adult Case Management Discharge Plan :  Will you be returning to the same living situation after discharge: Yes,  home with family At discharge, do you have transportation home?:Yes,  patient will arrange ride Do you have the ability to pay for your medications:Yes,  through community clinic  Release of information consent forms completed and in the chart;  Patient's signature needed at discharge.  Patient to Follow up at: Follow-up Information   Follow up with [facility]  On 10/10/2012. (Appointment is at 9:50 on Monday 3/24 for medication management)    Contact information:   [address] [place]  Phone: [phone]  Fax: [phone]        Patient denies SI/HI:   Yes,  denies both    Safety Planning and Suicide Prevention discussed:  No. Not applicable for Mitchell White as he had no SI at admit or during stay  Clide Dales 10/05/2012, 4:03 PM

## 2012-10-05 NOTE — Progress Notes (Signed)
Pt was discharged home today.  He denied any S/I H/I or A/V hallucinations.    He was given f/u appointment, rx, sample medications, hotline info booklet, taxi voucher.  He voiced understanding to all instructions provided.  He declined the need for smoking cessation materials.  He removed his nicotine patch before he left.

## 2012-10-05 NOTE — Progress Notes (Signed)
Adult Psychoeducational Group Note  Date:  10/05/2012 Time:  7:05 PM  Group Topic/Focus:  Personal Choices and Values:   The focus of this group is to help patients assess and explore the importance of values in their lives, how their values affect their decisions, how they express their values and what opposes their expression.  Participation Level:  Minimal  Participation Quality:  Drowsy  Affect:  Flat and Lethargic  Cognitive:  Lacking  Insight: None  Engagement in Group:  None  Modes of Intervention:  Education  Additional Comments:  Pt reported to group late and did not participate after reporting to group.  Reinaldo Raddle K 10/05/2012, 7:05 PM

## 2012-10-05 NOTE — [facility] Suicide Risk Assessment (Signed)
Suicide Risk Assessment  Discharge Assessment     Demographic Factors:  Male  Mental Status Per Nursing Assessment::   On Admission:  NA  Current Mental Status by Physician: In full contact with reality. There are no suicidal ideas, plans or intent. His mood is euthymic. His affect is constricted. Endorses willingness to abstain from drinking alcohol, using cocaine. Understands the effect of both on his health   Loss Factors: Decline in physical health  Historical Factors: NA  Risk Reduction Factors:   Sense of responsibility to family, Living with another person, especially a relative and Positive social support  Continued Clinical Symptoms:  Alcohol/Substance Abuse/Dependencies  Cognitive Features That Contribute To Risk:  Thought constriction (tunnel vision)    Suicide Risk:  Minimal: No identifiable suicidal ideation.  Patients presenting with no risk factors but with morbid ruminations; may be classified as minimal risk based on the severity of the depressive symptoms  Discharge Diagnoses:   AXIS I:  Alcohol Dependence, Cocaine Abuse AXIS II:  Deferred AXIS III:   Past Medical History  Diagnosis Date  . Mental disorder   . Coronary artery disease   . Asthma   . Stroke   . Hypertension    AXIS IV:  other psychosocial or environmental problems AXIS V:  61-70 mild symptoms  Plan Of Care/Follow-up recommendations:  Activity:  As tolerated Diet:  regular  Is patient on multiple antipsychotic therapies at discharge:  No   Has Patient had three or more failed trials of antipsychotic monotherapy by history:  No  Recommended Plan for Multiple Antipsychotic Therapies: N/A   Yanky Vanderburg A 10/05/2012, 3:57 PM

## 2012-10-05 NOTE — Progress Notes (Signed)
Recreation Therapy Notes  Date: 03.19.2014 Time: 2:20pm Location: [address]      Group Topic/Focus: Musician (AAA/T)  Participation Level: Did not attend  Additional Comments: Today's AAA/T session was a Educational psychologist Assisted Activities (AAA) session. Dog Team: Summer and handler.    Marykay Lex Pamella Samons, LRT/CTRS   Bertil Brickey L 10/05/2012 4:00 PM

## 2012-10-06 ENCOUNTER — Inpatient Hospital Stay: Payer: Self-pay | Admitting: Internal Medicine

## 2012-10-06 LAB — CBC WITH DIFFERENTIAL/PLATELET
Basophil #: 0.1 10*3/uL (ref 0.0–0.1)
Basophil %: 1.3 %
Eosinophil #: 0.2 10*3/uL (ref 0.0–0.7)
Eosinophil %: 2 %
HGB: 16.3 g/dL (ref 13.0–18.0)
Lymphocyte #: 4.6 10*3/uL — ABNORMAL HIGH (ref 1.0–3.6)
Lymphocyte %: 59.8 %
MCH: 29.4 pg (ref 26.0–34.0)
MCV: 89 fL (ref 80–100)
Monocyte #: 0.8 x10 3/mm (ref 0.2–1.0)

## 2012-10-06 LAB — COMPREHENSIVE METABOLIC PANEL
Alkaline Phosphatase: 104 U/L (ref 50–136)
Anion Gap: 7 (ref 7–16)
BUN: 9 mg/dL (ref 7–18)
Calcium, Total: 9 mg/dL (ref 8.5–10.1)
Co2: 30 mmol/L (ref 21–32)
EGFR (African American): >60
EGFR (Non-African Amer.): >60
Glucose: 88 mg/dL (ref 65–99)
Osmolality: 283 (ref 275–301)
Potassium: 4 mmol/L (ref 3.5–5.1)
SGOT(AST): 52 U/L — ABNORMAL HIGH (ref 15–37)
Sodium: 143 mmol/L (ref 136–145)

## 2012-10-06 LAB — TSH: Thyroid Stimulating Horm: 0.94 u[IU]/mL

## 2012-10-10 NOTE — Progress Notes (Signed)
Patient Discharge Instructions:  After Visit Summary (AVS):   Faxed to:  10/10/12 Discharge Summary Note:   Faxed to:  10/10/12 Psychiatric Admission Assessment Note:   Faxed to:  10/10/12 Suicide Risk Assessment - Discharge Assessment:   Faxed to:  10/10/12 Faxed/Sent to the Next Level Care provider:  10/10/12 Faxed to [facility] @ [address] 10/10/2012, 3:28 PM

## 2012-10-11 ENCOUNTER — Emergency Department: Payer: Self-pay | Admitting: Emergency Medicine

## 2013-01-01 ENCOUNTER — Emergency Department: Payer: Self-pay | Admitting: Emergency Medicine

## 2013-01-05 ENCOUNTER — Emergency Department: Payer: Self-pay | Admitting: Emergency Medicine

## 2013-01-06 LAB — COMPREHENSIVE METABOLIC PANEL
Albumin: 3.6 g/dL (ref 3.4–5.0)
Bilirubin,Total: 0.5 mg/dL (ref 0.2–1.0)
Calcium, Total: 9.1 mg/dL (ref 8.5–10.1)
Chloride: 106 mmol/L (ref 98–107)
EGFR (African American): >60
EGFR (Non-African Amer.): >60
Glucose: 103 mg/dL — ABNORMAL HIGH (ref 65–99)
Osmolality: 279 (ref 275–301)
Potassium: 4 mmol/L (ref 3.5–5.1)
SGOT(AST): 31 U/L (ref 15–37)
SGPT (ALT): 44 U/L (ref 12–78)
Sodium: 138 mmol/L (ref 136–145)
Total Protein: 7.6 g/dL (ref 6.4–8.2)

## 2013-01-06 LAB — CBC WITH DIFFERENTIAL/PLATELET
Basophil %: 0.8 %
Eosinophil #: 0.2 10*3/uL (ref 0.0–0.7)
Eosinophil %: 2.6 %
HCT: 47.8 % (ref 40.0–52.0)
Lymphocyte #: 5.1 10*3/uL — ABNORMAL HIGH (ref 1.0–3.6)
MCH: 28.2 pg (ref 26.0–34.0)
MCHC: 33.1 g/dL (ref 32.0–36.0)
MCV: 85 fL (ref 80–100)
Monocyte %: 11.2 %
Neutrophil #: 1.6 10*3/uL (ref 1.4–6.5)
Platelet: 342 10*3/uL (ref 150–440)
RBC: 5.61 10*6/uL (ref 4.40–5.90)
RDW: 18.8 % — ABNORMAL HIGH (ref 11.5–14.5)
WBC: 7.8 10*3/uL (ref 3.8–10.6)

## 2013-01-06 LAB — PROTIME-INR: Prothrombin Time: 13.6 secs (ref 11.5–14.7)

## 2014-01-03 IMAGING — CT CT HEAD WITHOUT CONTRAST
1 series · 15 of 30 positions shown, 19 images · non-contrast
Comparison: none

REASON FOR EXAM: cva
COMMENTS:   May transport without cardiac monitor

[Series 2: soft tissue · axial · 0.43mm/px · z∈[-16,+129]mm · 15 of 33 slices shown, 19 images]
[im 2/33  brain]
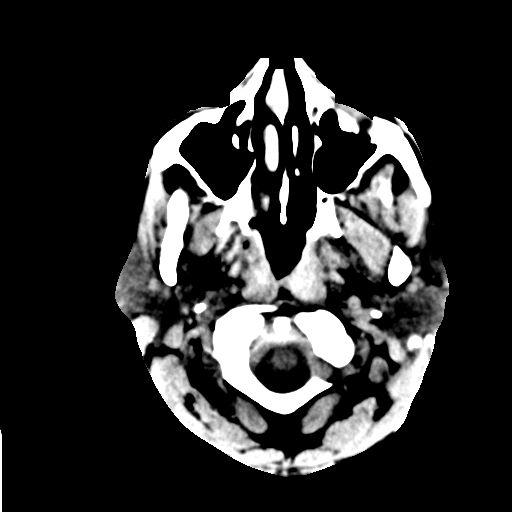
[im 2/33  bone]
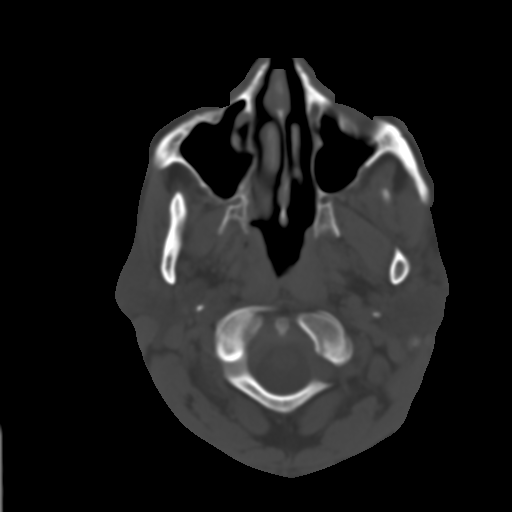
[im 4/33  brain]
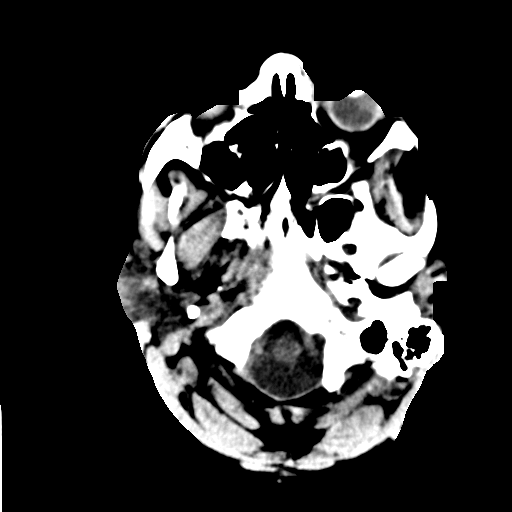
[im 6/33  brain]
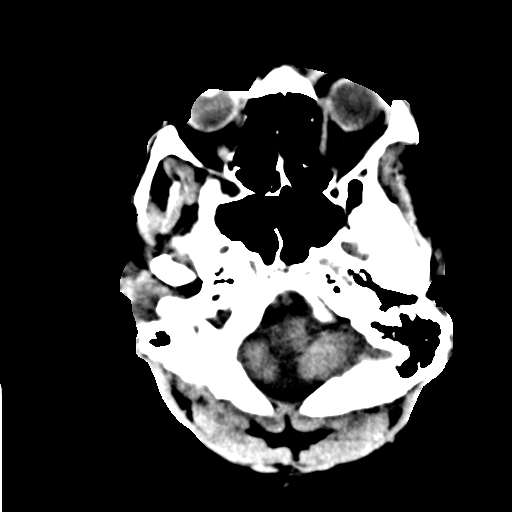
[im 8/33  brain]
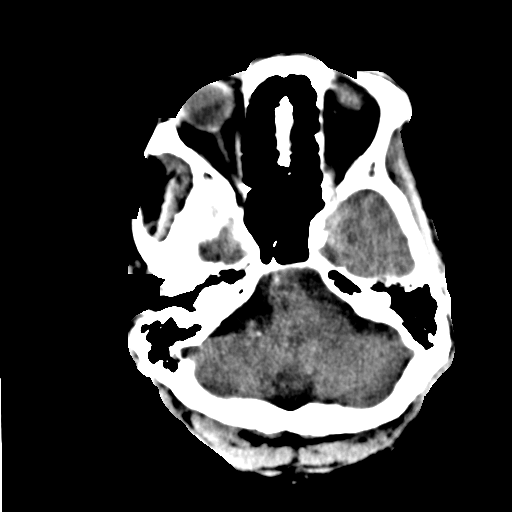
[im 10/33  brain]
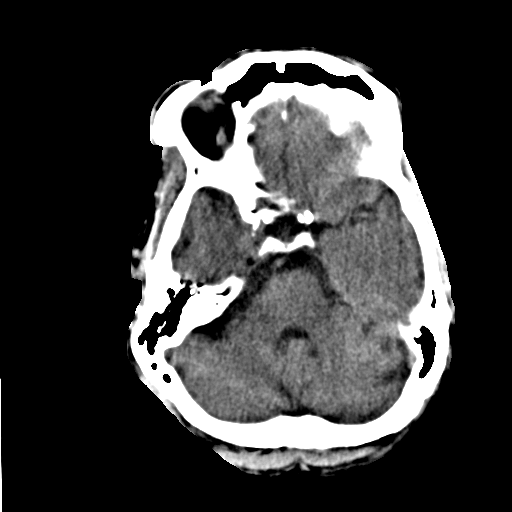
[im 10/33  bone]
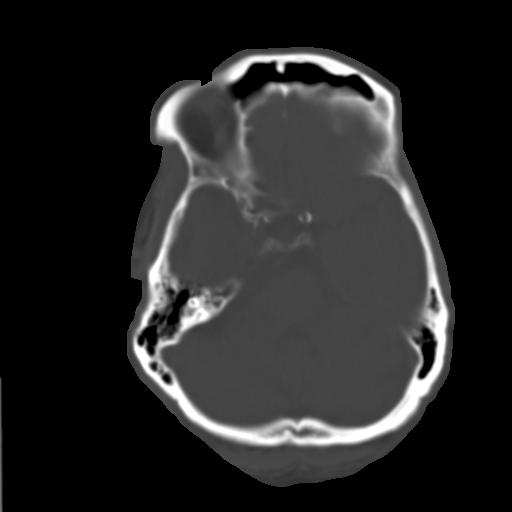
[im 13/33  brain]
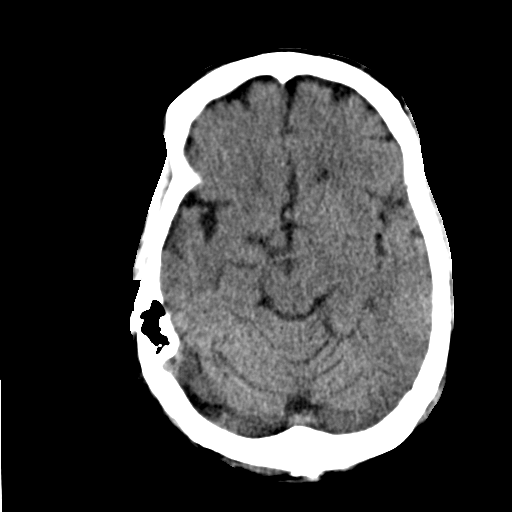
[im 15/33  brain]
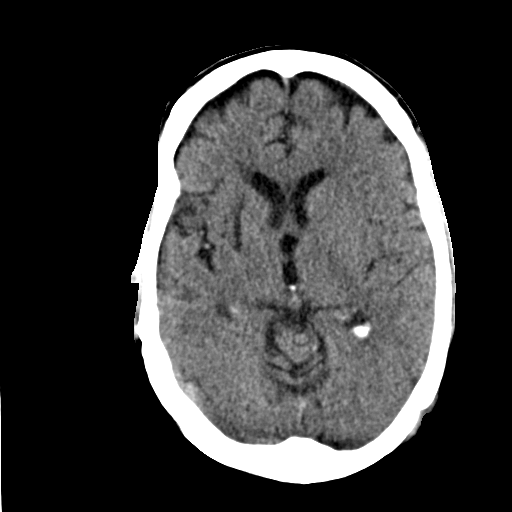
[im 17/33  brain]
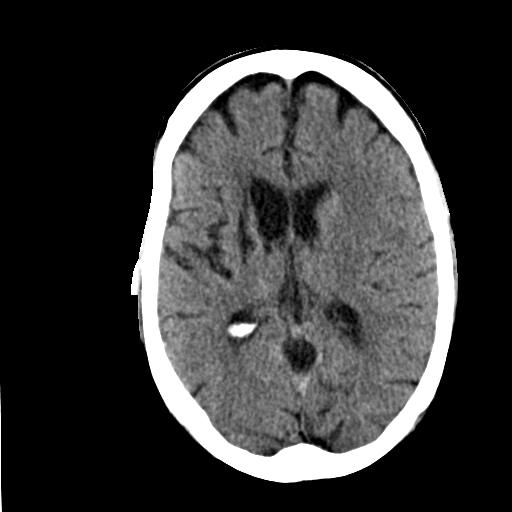
[im 18/33  brain]
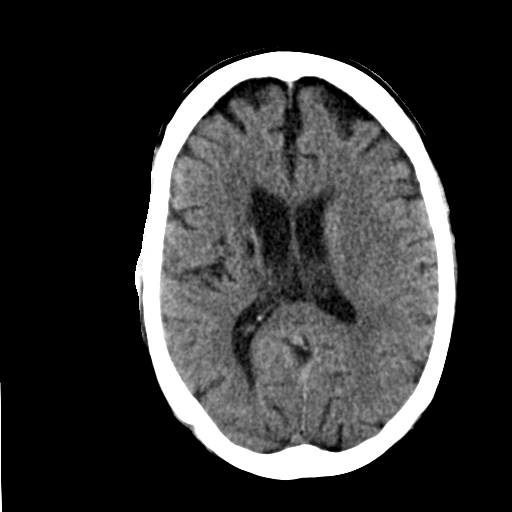
[im 18/33  bone]
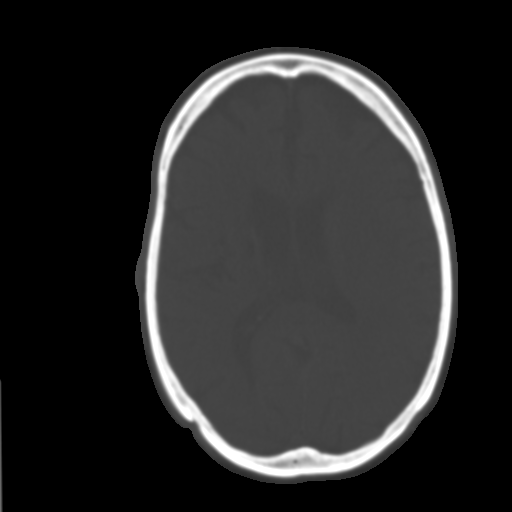
[im 20/33  brain]
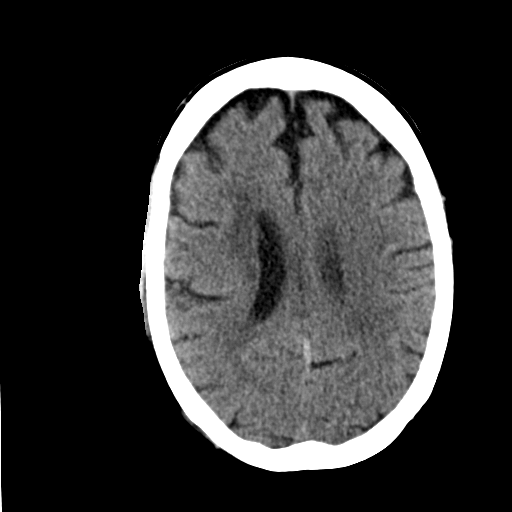
[im 23/33  brain]
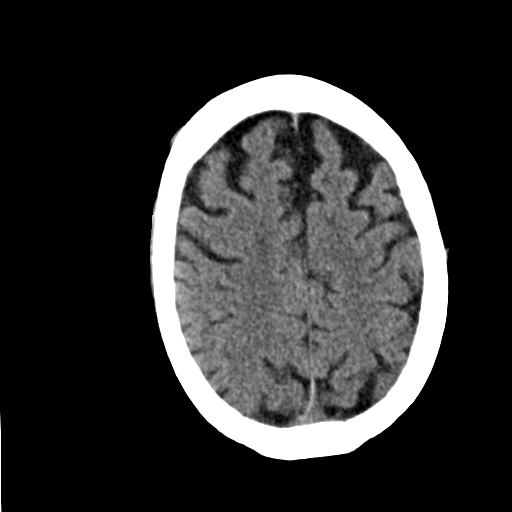
[im 25/33  brain]
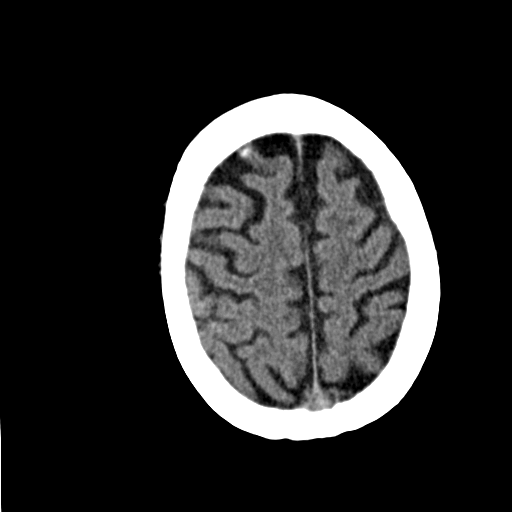
[im 27/33  brain]
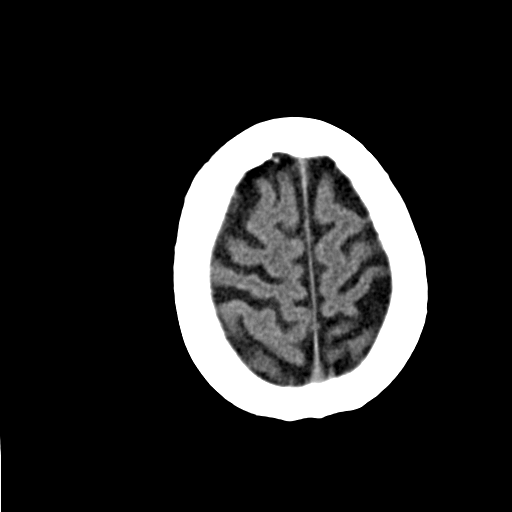
[im 27/33  bone]
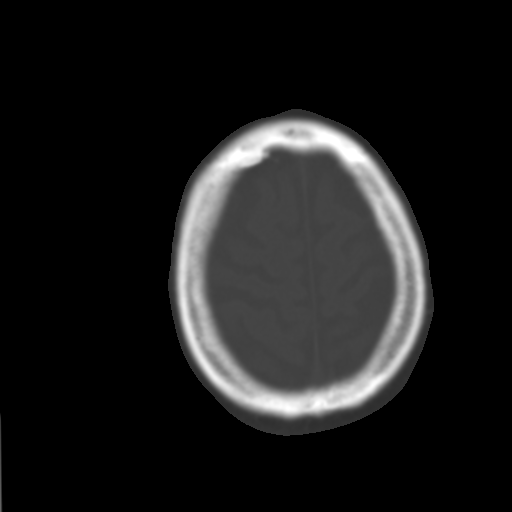
[im 29/33  brain]
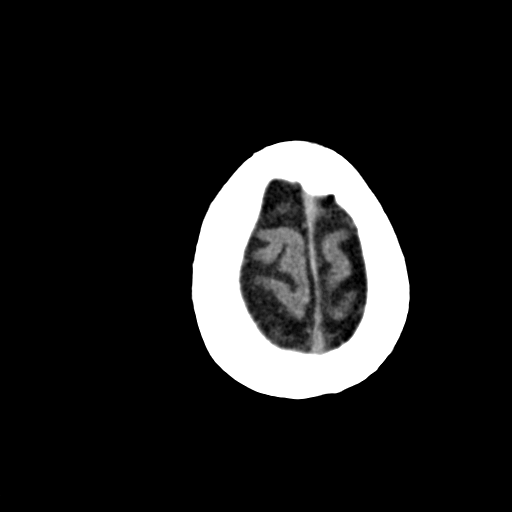
[im 31/33  brain]
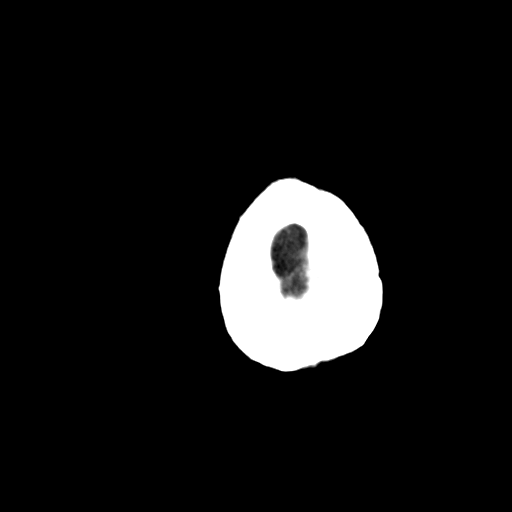

[15 of 30 positions shown; findings below may reference images not displayed]

PROCEDURE:     CT  - CT HEAD WITHOUT CONTRAST  - October 06, 2012 [DATE]

RESULT:     Axial noncontrast CT scanning was performed through the brain
with reconstructions at 5 mm intervals and slice thicknesses.

Comparison is made to a study April 02, 2012.

There is moderate diffuse cerebral and cerebellar atrophy with mild
compensatory ventriculomegaly. These findings are stable. There is a stable
bony density along the inner table of the right frontal region. This may
reflect a meningioma. There is stable encephalomalacia in the right basal
ganglia. There is no evidence of an acute intracranial hemorrhage nor of an
evolving ischemic infarction. The cerebellum and brainstem are normal in
density.

At bone window settings the observed portions of paranasal sinuses and
mastoid air cells are clear. There is no evidence of an acute skull fracture.
IMPRESSION: 1. There is no evidence of an acute ischemic or hemorrhagic event.
2. There is evidence of a previous CVA in the basal ganglia on the right.
The findings here appear stable.
3. There is no intracranial mass effect.
4. There may be a stable calcified inner table meningioma in the right
frontal region.

[REDACTED]

## 2014-11-06 NOTE — H&P (Signed)
PATIENT NAME:  Mitchell White, VICTORY MR#:  [id number] DATE OF BIRTH:  04-Mar-1956  DATE OF ADMISSION:  04/02/2012  PRIMARY CARE PHYSICIAN: [facility] ER REFERRING PHYSICIAN: Dr. Carollee Massed  History obtained from patient, old records reviewed, case discussed with ER physician. Imaging studies and EKG personally reviewed.   CHIEF COMPLAINT: Worsening left-sided weakness in the lower extremity.   HISTORY OF PRESENT ILLNESS: 59 year old African American male patient with history of hypertension, prior cerebrovascular accident with residual left-sided weakness presents to the Emergency Room complaining of six days of worsening left lower extremity weakness. Patient also has left-sided weakness that is more so in his left upper extremity and is able to walk but over the last six days has been dragging his leg. Also his blood pressure has been running high. Patient mentions that he tends to forget taking medications. His girlfriend gave him his regular medications earlier today morning.   CT scan of the head done today showed no acute abnormalities. Patient's blood pressure has been high up to 203/119.   PAST MEDICAL HISTORY:  1. Hypertension. 2. Hyperlipidemia. 3. 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 Has chronic left-sided weakness. Prior carotid ultrasound showed no significant stenosis. Echo showed ejection fraction of 55% with no significant abnormality.   PAST SURGICAL HISTORY: None.   DRUG ALLERGIES: None.   HOME MEDICATIONS:  1. Plavix 75 mg oral daily.  2. Lisinopril 20 mg oral daily.  3. Pravachol 40 mg daily.  4. Toprol-XL 50 mg daily.  5. Ventolin HFA 2 puffs as needed.   SOCIAL HISTORY: Patient lives with his girlfriend. Smokes 1 pack a day for 40 years now. Drinks a sixpack of beer over a week. He did abuse cocaine and marijuana in the past but does not use them anymore.   FAMILY HISTORY: Patient thinks his mom  might have hypertension.   REVIEW OF SYSTEMS: CONSTITUTIONAL: No fever, fatigue, weakness. EYES: No blurred vision, pain, discharge. ENT: No tinnitus, ear pain. RESPIRATORY: No cough, wheeze, hemoptysis. CARDIOVASCULAR: No chest pain, edema. GASTROINTESTINAL: No nausea, vomiting, diarrhea. GENITOURINARY: No dysuria, hematuria. ENDOCRINE: No polyuria, nocturia or thyroid problems. HEMATOLOGIC/LYMPHATIC: No anemia, easy bruising or bleeding. SKIN: No petechiae, rash or ulcers. MUSCULOSKELETAL: No joint pains. No back pain. NEUROLOGICAL: Chronic left-sided weakness with worsening of his left lower extremity weakness. PSYCHIATRIC: No anxiety or depression.   PHYSICAL EXAMINATION:  VITAL SIGNS: Temperature 98, pulse 75, respirations 18, blood pressure 203/119, saturating 94% on room air.  GENERAL: Obese African American male patient sitting up in bed, comfortable, conversational, cooperative with exam.   PSYCH: Alert and oriented x3. Mood and affect appropriate. Judgment intact.   HEENT: Atraumatic, normocephalic. Oral mucosa moist and pink. External ears and nose normal. No pallor. No icterus. Pupils bilaterally equal and reactive to light.   NECK: Supple. No thyromegaly. No palpable lymph nodes. Trachea midline. No carotid bruit, JVD.   CARDIOVASCULAR: S1, S2 with a 3/6 systolic murmur in the precordial region.   RESPIRATORY: Normal work of breathing. Clear to auscultation on both sides.   GASTROINTESTINAL: Soft abdomen, nontender. Bowel sounds present.   SKIN: Warm and dry. No petechiae, rash, ulcers.   GENITOURINARY: No CVA tenderness, bladder distention.   MUSCULOSKELETAL: No joint swelling, redness, effusion of the large joints. Normal muscle tone.   NEUROLOGICAL: Motor strength 5/5 in upper and lower extremities on the right side. Left side upper and lower extremities is 4/5. Sensation to fine  touch intact all over. Cranial nerves II through XII intact with mild left-sided facial droop  which is chronic.   LABORATORY, DIAGNOSTIC AND RADIOLOGICAL DATA: Glucose 83, BUN 13, creatinine 0.88, sodium 141, potassium 3.3. Troponin less than 0.02. WBC 6.8, hemoglobin 17.9, platelets 348.   Urinalysis shows no WBC or bacteria.   CT of the head shows no acute abnormalities.   ASSESSMENT AND PLAN:  1. Worsening left lower extremity weakness in a patient with chronic left-sided weakness and prior strokes. Patient has been noncompliant with medications raising his risk of further strokes. Will get an MRI and MRA of the head and neck. Will also get 2-D echocardiogram to look for any stenosis or clots. Patient will be restarted on his Plavix, statin. Will also consult physical therapy.  2. Malignant hypertension. Blood pressure is presently 203/119. His stroke has been over six days with worsening. Presently have to control the blood pressure. Will start him on his lisinopril, Toprol and we will use IV medications to bring his blood pressure slowly down.  3. Noncompliance. I have counseled the patient to be compliant with his medications.  4. Tobacco abuse. Patient has been counseled for more than three minutes to quit smoking stressing the risk for recurrent strokes along with coronary artery disease and lung problems. Patient mentions that he has tried in the past but it is difficult to quit and presently is at determined.  5. CODE STATUS: FULL CODE. 6. DVT prophylaxis with Lovenox. 7. Hypokalemia. Replace.   TIME SPENT: Time spent today on this case was 65 minutes with more than 50% time spent in coordination of care.   ____________________________ Molinda BailiffSrikar R. Tehani Mersman, MD srs:cms D: 04/02/2012 18:52:51 ET T: 04/03/2012 06:31:02 ET JOB#: [id number]  cc: Wardell HeathSrikar R. Lynnae Ludemann, MD, <Dictator> [facility] Orie FishermanSRIKAR R Ketzaly Cardella MD ELECTRONICALLY SIGNED 04/03/2012 17:57

## 2014-11-06 NOTE — Discharge Summary (Signed)
PATIENT NAME:  Mitchell White, Mitchell White MR#:  [id number] DATE OF BIRTH:  Dec 04, 1955  DATE OF ADMISSION:  04/03/2012 DATE OF DISCHARGE:  04/05/2012  PRIMARY CARE PHYSICIAN: Nonlocal   DISCHARGE DIAGNOSES:  1. Subacute cerebrovascular accident. 2. 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 3. Hypertension. 4. Tobacco abuse. 5. Alcohol abuse.   CONDITION: Stable.   CODE STATUS: FULL CODE.   HOME MEDICATIONS:  1. Pravastatin 40 mg p.o. at bedtime. 2. Plavix 75 mg p.o. daily.  3. Ventolin HFA 90 mcg inhalation 2 puffs p.r.n. 4. Multivitamin 1 tablet p.o. daily.  5. HCTZ 25 mg p.o. daily.  6. Lisinopril 20 mg p.o. 2 tablets p.o. daily.  7. Lopressor 100 mg p.o. b.i.d.  8. Nifedipine 30 mg p.o. daily.  9. Aspirin 81 mg p.o. daily.   DISCHARGE INSTRUCTIONS: Patient needs home physical therapy.   DIET: Low sodium, low fat, low cholesterol diet.   ACTIVITY: As tolerated.   FOLLOW-UP CARE: Follow up PCP within one week. Follow up neurologist, Dr. Sherryll BurgerShah, within one week   REASON FOR ADMISSION: Worsening left side weakness in the lower extremity.   HOSPITAL COURSE: Patient is a 59 year old African American male with a history of hypertension, hyperlipidemia, CVA with left side weakness presented to the ED with worsening left side weakness. For detailed history and physical examination, please refer to the admission note dictated by Dr. Elpidio AnisSudini. On admission date, patient's CAT scan of head showed no acute abnormality. Laboratory data showed BUN 13, creatinine 0.88, potassium 3.3, sodium 141, troponin less than 0.02. After admission patient has been treated with aspirin, Plavix and statin. Patient got MRA of brain, which showed severe atherosclerotic disease within the circle of Willis and MRI of brain shows acute or subacute lacunar infarction. We requested neurology consult for acute or subacute cerebrovascular accident. According to Dr. Clelia CroftShaw patient can follow up as an outpatient. In  addition, patient's echocardiogram showing ejection fraction 50% to 55%. No thrombosis or regurgitation Patient also received physical therapy.   Malignant hypertension. Patient's blood pressure was 203/119. She has been treated with multiple hypertension medications but still blood pressure was not controlled so we add obtained Lopressor and Nifedipine. Blood pressure now is under control.   Patient has tobacco abuse which was counseled. Patient needs smoking cessation.   Noncompliance. Patient was counseled to be compliant with his medications.   I discussed with case manager about patient's situation. Patient is clinically stable, will be discharged to home with home PT today. In addition, we gave all the prescriptions on the discharge medication list. Discussed the patient's disease plan with the patient, nurse, case manager.   TIME SPENT: About 45 minutes.  ____________________________ Shaune PollackQing Dillinger Aston, MD qc:cms D: 04/05/2012 17:34:25 ET T: 04/06/2012 11:45:17 ET JOB#: [id number]  cc: Shaune PollackQing Jupiter Kabir, MD, <Dictator>  Shaune PollackQING Wendi Lastra MD ELECTRONICALLY SIGNED 04/07/2012 14:47

## 2014-11-09 NOTE — H&P (Signed)
PATIENT NAME:  Mitchell White, [name] MR#:  [id number] DATE OF BIRTH:  1956/05/08  DATE OF ADMISSION:  10/06/2012  PRIMARY CARE PHYSICIAN: [facility]  ER REFERRING PHYSICIAN: Dorothea GlassmanPaul Malinda, MD   CHIEF COMPLAINT: Worsening left-sided weakness of the lower extremity and new left-sided facial droop along with slurred speech which has resolved at this time.   HISTORY OF PRESENT ILLNESS: The patient is a 59 year old African American male patient with history of hypertension, noncompliance, prior CVA with residual left-sided weakness, presents to the Emergency Room complaining of 1 day of worsening left lower extremity weakness, left facial droop and slurred speech. His girlfriend at bedside also contributes history. Old records have been reviewed. Imaging studies were reviewed. The case was discussed with the ER physician.   [facility] going through detox for alcohol and cocaine. He arrived at home.  His girlfriend noticed some slurred speech, worsening left-sided weakness, with the patient dragging his left foot while he was walking with a cane, also new facial droop, but the patient presented today and his slurred speech has resolved at this time.   The patient mentions that he has been compliant with his medications since discharge, has not had any alcohol or cocaine, but continues to smoke.   PAST MEDICAL HISTORY:  Hypertension, hyperlipidemia, alcohol abuse, cocaine abuse and prior MCA distribution stroke.   PAST SURGICAL HISTORY: None.   ALLERGIES:  No known drug allergies.    HOME MEDICATIONS: 1. Plavix 75 mg oral once a day.  2. Lisinopril 20 mg oral daily.  3. Pravachol 40 mg daily.  4. Toprol-XL 50 mg daily.  5. Hydrochlorothiazide 25 mg daily.  6. Ventolin HFA 2 puffs as needed every 4 hours.   SOCIAL HISTORY: The patient lives with his girlfriend. He smokes a pack a day for 40 years now. He drinks a 6-pack of beer every day  but has not had any alcohol since [facility] for cocaine and marijuana abuse.   FAMILY HISTORY: His mom had hypertension.   REVIEW OF SYSTEMS:   CONSTITUTIONAL: No fever, fatigue, weakness.  EYES: No blurred vision, pain, discharge, redness.  ENT: No tinnitus, ear pain.  RESPIRATORY: No cough, wheezing, hemoptysis.  CARDIOVASCULAR: No chest pain, edema, orthopnea. GASTROINTESTINAL: No nausea, vomiting, diarrhea, abdominal pain.  GENITOURINARY: No dysuria or hematuria.  ENDOCRINE: No polyuria, nocturia, thyroid problems.  HEMATOLOGIC AND LYMPHATIC: No anemia, easy bruising, bleeding.  SKIN: No petechiae, rash, ulcers.  MUSCULOSKELETAL: No joint pain. No back pain.  NEUROLOGICAL: No seizures. Has chronic left-sided weakness with worsening presently on the left lower extremity and new slurred speech and left facial droop.  PSYCHIATRIC: No anxiety or depression.   PHYSICAL EXAMINATION: VITAL SIGNS: Temperature 97.6, pulse 70, respirations 18, blood pressure 119/106.  GENERAL: Obese African American male patient lying in bed, comfortable, conversational, cooperative with exam.  PSYCHIATRIC: Alert, oriented x3. Mood and affect appropriate. Judgment intact.  HEENT: Atraumatic, normocephalic. Oral mucosa moist and pink. External ears and nose normal. No pallor, no icterus. Pupils are bilaterally equal and reactive to light. Has left facial droop.  NECK: Supple. No thyromegaly. No palpable lymph nodes. Trachea midline. No carotid bruits, JVD.  CARDIOVASCULAR: S1 and S2 with a 3/6 systolic murmur. Peripheral pulses 2+.  RESPIRATORY: Normal work of breathing. Clear to auscultation on both sides. Normal percussion note.  GASTROINTESTINAL: Soft abdomen, nontender. Bowel sounds present. No hepatosplenomegaly palpable.  SKIN:  Warm and  dry.  No petechiae, rash or ulcers.  GENITOURINARY: No CVA tenderness or bladder distention.  MUSCULOSKELETAL: No joint swelling,  redness, effusion of the large joints. Normal muscle tone.  NEUROLOGICAL: Motor strength 5 out of 5 in upper and lower extremities on the right side. Left upper extremity of 3 out of 5 and left lower extremity of 4 out of 5 with more weakness of 3 out of 5 with dorsi and plantar flexion of the foot. Sensation to fine touch intact all over. Cranial nerves II through XII intact except the left-sided facial droop, which has also worsened.   LABORATORY AND RADIOLOGIC DATA:  Glucose 88, BUN 9, creatinine 0.90, albumin 3.7. AST, ALT, alkaline phosphatase normal. Troponin less than 0.02. TSH of 0.94, WBC  hemoglobin 16.3.   CT scan of the head without contrast shows no evidence of acute ischemic or hemorrhagic event. Evidence of prior CVA in the basal ganglia on the right, and findings appears stable. 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  EKG shows normal sinus rhythm with nonspecific ST-T wave changes, has tall P waves in V2 and V3 which compares to prior EKGs.   ASSESSMENT AND PLAN: 1. Acute cerebrovascular accident with new slurred speech, worsening left facial droop and left lower extremity: We will admit the patient as inpatient with neuro checks every 4 hours. Continue his aspirin and Plavix, statin. The patient will be on fall precautions. Get an MRI of the head. The patient has had carotid Dopplers in the past. We will get an MRA of the head and brain and neck. We will not get an echocardiogram as the patient has had these before and have not shown any significant findings. We will also consult PT, speech and case manager for help.  2. Uncontrolled hypertension: The patient has had a prior history of noncompliance but has mentioned that he has been taking his medication since discharge from the [facility]. This could be secondary to his acute cerebrovascular accident. I will continue to monitor the blood pressure.  I will not to try to  control it unless it goes over 220/120.  I will continue his metoprolol and hydrochlorothiazide but hold one of his medications, Lisinopril, at this time.  3. Alcohol abuse: The patient has gone through detox, has not had any alcohol. He should not be going through withdrawals but we will monitor.  4. Tobacco abuse: The patient has been counseled for greater than 3 minutes to quit smoking, but he does not seem to be determined at this time. I explained its effects on different organ systems in the setting of hypertension and prior CVAs.  5. Hyperlipidemia: The patient is on a statin. We will check a lipid profile in the morning.  6. Deep vein thrombosis prophylaxis: With Lovenox.   CODE STATUS:  FULL CODE.     TIME SPENT: Time spent today on this case was 60 minutes with more than 50% time spent in coordination of care.   ____________________________ Molinda Bailiff. Sansa Alkema, MD srs:cb D: 10/06/2012 15:22:50 ET T: 10/06/2012 15:47:20 ET JOB#: [id number]  cc: Wardell Heath R. Keegen Heffern, MD, <Dictator> [facility] Orie Fisherman MD ELECTRONICALLY SIGNED 10/12/2012 19:49

## 2017-01-17 DEATH — deceased
